# Patient Record
Sex: Male | Born: 1970 | Race: Black or African American | Hispanic: No | Marital: Single | Smoking: Never smoker
Health system: Southern US, Community
[De-identification: ages and names within clinical notes are randomized; demographics above are authoritative.]

## PROBLEM LIST (undated history)

## (undated) DIAGNOSIS — R208 Other disturbances of skin sensation: Secondary | ICD-10-CM

## (undated) DIAGNOSIS — J301 Allergic rhinitis due to pollen: Secondary | ICD-10-CM

## (undated) DIAGNOSIS — M1990 Unspecified osteoarthritis, unspecified site: Secondary | ICD-10-CM

## (undated) DIAGNOSIS — Z807 Family history of other malignant neoplasms of lymphoid, hematopoietic and related tissues: Secondary | ICD-10-CM

## (undated) HISTORY — PX: ORIF TIBIA FRACTURE: SHX5416

## (undated) HISTORY — DX: Other disturbances of skin sensation: R20.8

## (undated) HISTORY — PX: ORIF ANKLE FRACTURE: SHX5408

## (undated) HISTORY — DX: Unspecified osteoarthritis, unspecified site: M19.90

## (undated) HISTORY — DX: Allergic rhinitis due to pollen: J30.1

## (undated) HISTORY — DX: Family history of other malignant neoplasms of lymphoid, hematopoietic and related tissues: Z80.7

## (undated) HISTORY — PX: HIP SURGERY: SHX245

## (undated) HISTORY — PX: FRACTURE SURGERY: SHX138

## (undated) HISTORY — PX: KNEE ARTHROSCOPY: SUR90

---

## 2012-11-23 ENCOUNTER — Ambulatory Visit (INDEPENDENT_AMBULATORY_CARE_PROVIDER_SITE_OTHER): Payer: 59 | Admitting: Family Medicine

## 2012-11-23 ENCOUNTER — Encounter: Payer: Self-pay | Admitting: Family Medicine

## 2012-11-23 VITALS — BP 130/80 | HR 76 | Temp 98.3°F | Ht 73.0 in | Wt 243.5 lb

## 2012-11-23 DIAGNOSIS — R209 Unspecified disturbances of skin sensation: Secondary | ICD-10-CM

## 2012-11-23 DIAGNOSIS — M549 Dorsalgia, unspecified: Secondary | ICD-10-CM

## 2012-11-23 DIAGNOSIS — R5383 Other fatigue: Secondary | ICD-10-CM

## 2012-11-23 DIAGNOSIS — Z0000 Encounter for general adult medical examination without abnormal findings: Secondary | ICD-10-CM

## 2012-11-23 DIAGNOSIS — Z131 Encounter for screening for diabetes mellitus: Secondary | ICD-10-CM

## 2012-11-23 DIAGNOSIS — R208 Other disturbances of skin sensation: Secondary | ICD-10-CM

## 2012-11-23 DIAGNOSIS — Z13220 Encounter for screening for lipoid disorders: Secondary | ICD-10-CM

## 2012-11-23 DIAGNOSIS — J309 Allergic rhinitis, unspecified: Secondary | ICD-10-CM

## 2012-11-23 DIAGNOSIS — Z807 Family history of other malignant neoplasms of lymphoid, hematopoietic and related tissues: Secondary | ICD-10-CM

## 2012-11-23 HISTORY — DX: Family history of other malignant neoplasms of lymphoid, hematopoietic and related tissues: Z80.7

## 2012-11-23 NOTE — Progress Notes (Signed)
Nature conservation officer at [facility] [address] [place] [address] Phone: [phone] Fax: [phone]  Date:  11/23/2012   Name:  Derek Huffman   DOB:  01/31/1971   MRN:  [id number] Gender: male Age: 42 y.o.  Primary Physician:  Hannah Beat, MD  Evaluating MD: Hannah Beat, MD   Chief Complaint: Establish Care   History of Present Illness:  Derek Huffman is a 42 y.o. pleasant patient who presents with the following:  Crushed between metal pole and a vehicle.  Cannot bend last 3 toes  Boiled, broiledgoo and  Mostly water, juice.   Preventative Health Maintenance Visit:  Health Maintenance Summary Reviewed and updated, unless pt declines services.  Tobacco History Reviewed. Alcohol: No concerns, no excessive use Exercise Habits: less now than in past STD concerns: no risk or activity to increase risk Drug Use: None Encouraged self-testicular check   Patient Active Problem List   Diagnosis Date Noted  . Allergic rhinitis 11/24/2012  . Family history of multiple myeloma 11/23/2012    Past Medical History  Diagnosis Date  . Arthritis   . Family history of multiple myeloma 11/23/2012    Past Surgical History  Procedure Laterality Date  . Fracture surgery Right   . Orif ankle fracture Right   . Orif tibia fracture Right   . Hip surgery Right   . Knee arthroscopy Right     History   Social History  . Marital Status: Single    Spouse Name: N/A    Number of Children: N/A  . Years of Education: N/A   Occupational History  . Not on file.   Social History Main Topics  . Smoking status: Never Smoker   . Smokeless tobacco: Never Used  . Alcohol Use: No  . Drug Use: No  . Sexually Active: Yes -- Male partner(s)   Other Topics Concern  . Not on file   Social History Narrative   Formerly Army x 9 years    Family History  Problem Relation Age of Onset  . Multiple myeloma Father 51    Spinal cancer? (presume MM)    Allergies   Allergen Reactions  . Aspirin Anaphylaxis    No current outpatient prescriptions on file prior to visit.   No current facility-administered medications on file prior to visit.     Review of Systems:   Wt Readings from Last 3 Encounters:  11/23/12 243 lb 8 oz (110.451 kg)    GEN: well developed, well nourished, no acute distress Eyes: conjunctiva and lids normal, PERRLA, EOMI ENT: TM clear, nares clear, oral exam WNL Neck: supple, no lymphadenopathy, no thyromegaly, no JVD Pulm: clear to auscultation and percussion, respiratory effort normal CV: regular rate and rhythm, S1-S2, no murmur, rub or gallop, no bruits, peripheral pulses normal and symmetric, no cyanosis, clubbing, edema or varicosities Chest: no scars, masses GI: soft, non-tender; no hepatosplenomegaly, masses; active bowel sounds all quadrants GU: no hernia, testicular mass, penile discharge, or prostate enlargement Lymph: no cervical, axillary or inguinal adenopathy MSK: ONGOING PAIN AND DISCOMFORT WITH R ANKLE AND LEG SKIN: clear, good turgor, color WNL, no rashes, lesions, or ulcerations Neuro: normal mental status, normal strength, sensation, and motion Psych: alert; oriented to person, place and time, normally interactive and not anxious or depressed in appearance.   Physical Examination: BP 130/80  Pulse 76  Temp(Src) 98.3 F (36.8 C) (Oral)  Ht 6\' 1"  (1.854 m)  Wt 243 lb 8 oz (110.451 kg)  BMI  32.13 kg/m2  SpO2 97%  Ideal Body Weight: Weight in (lb) to have BMI = 25: 189.1  GEN: well developed, well nourished, no acute distress Eyes: conjunctiva and lids normal, PERRLA, EOMI ENT: TM clear, nares clear, oral exam WNL Neck: supple, no lymphadenopathy, no thyromegaly, no JVD Pulm: clear to auscultation and percussion, respiratory effort normal CV: regular rate and rhythm, S1-S2, no murmur, rub or gallop, no bruits, peripheral pulses normal and symmetric, no cyanosis, clubbing, edema or  varicosities GI: soft, non-tender; no hepatosplenomegaly, masses; active bowel sounds all quadrants GU: no hernia, testicular mass, penile discharge Lymph: no cervical, axillary or inguinal adenopathy MSK: gait normal, muscle tone and strength WNL SKIN: clear, good turgor, color WNL, no rashes, lesions, or ulcerations Neuro: normal mental status, normal strength, sensation, and motion Psych: alert; oriented to person, place and time, normally interactive and not anxious or depressed in appearance.  Assessment and Plan:  Routine general medical examination at a health care facility - Plan: Basic metabolic panel, CBC With differential/Platelet, Hepatic function panel, Lipid panel, Protein electrophoresis, serum, IGE  Family history of multiple myeloma - Plan: Basic metabolic panel, CBC With differential/Platelet, Hepatic function panel, Lipid panel, Protein electrophoresis, serum, IGE, CANCELED: Protein electrophoresis, serum  Back pain - Plan: Basic metabolic panel, CBC With differential/Platelet, Hepatic function panel, Lipid panel, Protein electrophoresis, serum, IGE, CANCELED: Protein electrophoresis, serum  Screening for diabetes mellitus - Plan: Basic metabolic panel, CBC With differential/Platelet, Hepatic function panel, Lipid panel, Protein electrophoresis, serum, IGE, CANCELED: Basic metabolic panel  Other malaise and fatigue - Plan: Basic metabolic panel, CBC With differential/Platelet, Hepatic function panel, Lipid panel, Protein electrophoresis, serum, IGE, CANCELED: CBC with Differential, CANCELED: Hepatic function panel  Screening for lipoid disorders - Plan: Basic metabolic panel, CBC With differential/Platelet, Hepatic function panel, Lipid panel, Protein electrophoresis, serum, IGE, CANCELED: LDL cholesterol, direct, CANCELED: Lipid panel  Allergic rhinitis - Plan: Basic metabolic panel, CBC With differential/Platelet, Hepatic function panel, Lipid panel, Protein  electrophoresis, serum, IGE, CANCELED: IGE  Decreased sensation of foot  No labs in some time. Check all these Check allergy - IgE High level of concern or potential spine CA - reasonable to check SPEP  Orders Today:  Orders Placed This Encounter  Procedures  . Basic metabolic panel  . CBC With differential/Platelet  . Hepatic function panel  . Lipid panel  . Protein electrophoresis, serum  . IGE    Updated Medication List: (Includes new medications, updates to list, dose adjustments) Meds ordered this encounter  Medications  . Multiple Vitamin (MULTIVITAMIN WITH MINERALS) TABS    Sig: Take 1 tablet by mouth daily.    Medications Discontinued: There are no discontinued medications.    Signed, Elpidio Galea. Gerald Kuehl, MD 11/23/2012 2:16 PM

## 2012-11-24 ENCOUNTER — Encounter: Payer: Self-pay | Admitting: Family Medicine

## 2012-11-24 DIAGNOSIS — R208 Other disturbances of skin sensation: Secondary | ICD-10-CM | POA: Insufficient documentation

## 2012-11-24 DIAGNOSIS — J309 Allergic rhinitis, unspecified: Secondary | ICD-10-CM | POA: Insufficient documentation

## 2012-11-25 LAB — HEPATIC FUNCTION PANEL
AST: 22 IU/L (ref 0–40)
Albumin: 4.1 g/dL (ref 3.5–5.5)
Alkaline Phosphatase: 107 IU/L (ref 39–117)
Bilirubin, Direct: 0.17 mg/dL (ref 0.00–0.40)

## 2012-11-25 LAB — PROTEIN ELECTROPHORESIS, SERUM
Alpha 2: 0.5 g/dL (ref 0.4–1.2)
Beta: 1.1 g/dL (ref 0.6–1.3)
Gamma Globulin: 1.1 g/dL (ref 0.5–1.6)
Globulin, Total: 3 g/dL (ref 2.0–4.5)

## 2012-11-25 LAB — BASIC METABOLIC PANEL
BUN/Creatinine Ratio: 10 (ref 8–19)
BUN: 11 mg/dL (ref 6–20)
Calcium: 9.2 mg/dL (ref 8.7–10.2)
Creatinine, Ser: 1.14 mg/dL (ref 0.76–1.27)
GFR calc non Af Amer: 81 mL/min/{1.73_m2} (ref >59)
Sodium: 142 mmol/L (ref 134–144)

## 2012-11-25 LAB — CBC WITH DIFFERENTIAL
Basos: 0 % (ref 0–3)
Eos: 3 % (ref 0–5)
Eosinophils Absolute: 0.1 10*3/uL (ref 0.0–0.4)
HCT: 44.7 % (ref 37.5–51.0)
Hemoglobin: 15.2 g/dL (ref 12.6–17.7)
Immature Grans (Abs): 0 10*3/uL (ref 0.0–0.1)
Lymphs: 29 % (ref 14–46)
MCH: 28.4 pg (ref 26.6–33.0)
MCHC: 34 g/dL (ref 31.5–35.7)
Neutrophils Absolute: 2.5 10*3/uL (ref 1.4–7.0)
RBC: 5.35 x10E6/uL (ref 4.14–5.80)

## 2012-11-25 LAB — LIPID PANEL
HDL: 41 mg/dL (ref >39)
Triglycerides: 99 mg/dL (ref 0–149)

## 2012-11-26 ENCOUNTER — Encounter: Payer: Self-pay | Admitting: *Deleted

## 2012-12-15 ENCOUNTER — Encounter: Payer: Self-pay | Admitting: Family Medicine

## 2013-04-28 ENCOUNTER — Encounter: Payer: Self-pay | Admitting: Family Medicine

## 2013-04-28 ENCOUNTER — Ambulatory Visit (INDEPENDENT_AMBULATORY_CARE_PROVIDER_SITE_OTHER): Payer: 59 | Admitting: Family Medicine

## 2013-04-28 VITALS — BP 108/82 | HR 79 | Temp 98.1°F | Ht 73.0 in | Wt 245.5 lb

## 2013-04-28 DIAGNOSIS — H938X9 Other specified disorders of ear, unspecified ear: Secondary | ICD-10-CM

## 2013-04-28 DIAGNOSIS — K625 Hemorrhage of anus and rectum: Secondary | ICD-10-CM

## 2013-04-28 DIAGNOSIS — H938X3 Other specified disorders of ear, bilateral: Secondary | ICD-10-CM

## 2013-04-28 NOTE — Progress Notes (Signed)
Pre-visit discussion using our clinic review tool. No additional management support is needed unless otherwise documented below in the visit note.  

## 2013-04-28 NOTE — Progress Notes (Signed)
Date:  04/28/2013   Name:  Derek Huffman   DOB:  08-Jul-1970   MRN:  [id number] Gender: male Age: 42 y.o.  Primary Physician:  Hannah Beat, MD   Chief Complaint: Rectal Bleeding and Check Ears For wax   History of Present Illness:  Derek Huffman is a 42 y.o. pleasant patient who presents with the following:  Wonders if ears near to be cleaned. Had some blood in his stool. A few times this week with bright red blood, then some dark stools which promptly resolved. No problems the last couple of days.  Wt Readings from Last 3 Encounters:  04/28/13 245 lb 8 oz (111.358 kg)  11/23/12 243 lb 8 oz (110.451 kg)     Patient Active Problem List   Diagnosis Date Noted  . Allergic rhinitis 11/24/2012  . Decreased sensation of foot   . Family history of multiple myeloma 11/23/2012    Past Medical History  Diagnosis Date  . Arthritis   . Family history of multiple myeloma 11/23/2012  . Allergic rhinitis due to pollen   . Decreased sensation of foot     Outer 3 toes    Past Surgical History  Procedure Laterality Date  . Fracture surgery Right   . Orif ankle fracture Right   . Orif tibia fracture Right   . Hip surgery Right   . Knee arthroscopy Right     History   Social History  . Marital Status: Single    Spouse Name: N/A    Number of Children: N/A  . Years of Education: N/A   Occupational History  . Not on file.   Social History Main Topics  . Smoking status: Never Smoker   . Smokeless tobacco: Never Used  . Alcohol Use: No  . Drug Use: No  . Sexual Activity: Yes    Partners: Female   Other Topics Concern  . Not on file   Social History Narrative   Formerly Army x 9 years    Family History  Problem Relation Age of Onset  . Multiple myeloma Father 22    Spinal cancer? (presume MM)    Allergies  Allergen Reactions  . Aspirin Anaphylaxis    Medication list has been reviewed and updated.  Review of Systems:   GEN: No acute illnesses, no  fevers, chills. GI: No n/v/d, eating normally Pulm: No SOB Interactive and getting along well at home.  Otherwise, ROS is as per the HPI.   Physical Examination: BP 108/82  Pulse 79  Temp(Src) 98.1 F (36.7 C) (Oral)  Ht 6\' 1"  (1.854 m)  Wt 245 lb 8 oz (111.358 kg)  BMI 32.40 kg/m2  Ideal Body Weight: Weight in (lb) to have BMI = 25: 189.1  GEN: WDWN, NAD, Non-toxic, A & O x 3 HEENT: Atraumatic, Normocephalic. Neck supple. No masses, No LAD. Ears and Nose: No external deformity. CV: RRR, No M/G/R. No JVD. No thrill. No extra heart sounds. PULM: CTA B, no wheezes, crackles, rhonchi. No retractions. No resp. distress. No accessory muscle use. ABD: S, NT, ND, +BS. No rebound. No HSM. Rectal: good tone. No fissure seen. Small internal hemorrhoid? But definitely none large EXTR: No c/c/e NEURO Normal gait.  PSYCH: Normally interactive. Conversant. Not depressed or anxious appearing.  Calm demeanor.    Assessment and Plan:  Rectal bleeding  Ear fullness, bilateral  Resolved, i think ok to watch, but if he has return of blood again, then i think we need to consult  gi.   Ears are clean.  Signed,  Elpidio Galea. Elda Dunkerson, MD, CAQ Sports Medicine  [facility] [address] [place] [address] Phone: [phone] Fax: [phone]  Updated Complete Medication List:   Medication List       This list is accurate as of: 04/28/13 11:59 PM.  Always use your most recent med list.               multivitamin with minerals Tabs tablet  Take 1 tablet by mouth daily.

## 2014-05-13 ENCOUNTER — Ambulatory Visit (INDEPENDENT_AMBULATORY_CARE_PROVIDER_SITE_OTHER): Payer: 59 | Admitting: Family Medicine

## 2014-05-13 ENCOUNTER — Encounter: Payer: Self-pay | Admitting: Family Medicine

## 2014-05-13 ENCOUNTER — Encounter (INDEPENDENT_AMBULATORY_CARE_PROVIDER_SITE_OTHER): Payer: Self-pay

## 2014-05-13 VITALS — BP 110/76 | HR 56 | Temp 97.9°F | Ht 73.0 in | Wt 252.0 lb

## 2014-05-13 DIAGNOSIS — R202 Paresthesia of skin: Secondary | ICD-10-CM

## 2014-05-13 DIAGNOSIS — R200 Anesthesia of skin: Secondary | ICD-10-CM | POA: Insufficient documentation

## 2014-05-13 DIAGNOSIS — M79602 Pain in left arm: Secondary | ICD-10-CM | POA: Insufficient documentation

## 2014-05-13 NOTE — Patient Instructions (Signed)
Ibuprofen 800 mg every 8 hours for pain. Stop at front desk to set up nerve conduction.  Limit pressure on elbow in case this is ulnar neuropathy.

## 2014-05-13 NOTE — Progress Notes (Signed)
Subjective:    Patient ID: Derek Huffman, male    DOB: 1970-07-22, 43 y.o.   MRN: [id number]  HPI    43 year old male pt of Dr. Cyndie Chimeopland's presents with new onset left hand numbness  X 3-4 months. Started in pinky tingling and extends to rest of hand. Intermittently numb and tingling. Occuring 4-5 times a day. Gradually worsening. Arm soreness in upper arm, no in wrist or hand started in last few months. Notes after carrying laundry basket but also at rest. No weakness. No neck pain, no shooting pain.  In last 3 days he has noted numbness in right pinky. No pain in right arm.  He does put pressure on left elbow as a habit.  Tylenol helps with arm pain.   Had similar issue 3.5 years ago. Went away on its own.  Had neg EKG. Had MRI brain: no MS, no CVA.  Hx of broken wrist as teenage. Unemployed, doing some yard or house work.     Review of Systems  Constitutional: Negative for fever and fatigue.  HENT: Negative for ear pain.   Eyes: Negative for pain.  Respiratory: Negative for shortness of breath.   Cardiovascular: Negative for chest pain.  Gastrointestinal: Negative for abdominal pain.       Objective:   Physical Exam  Constitutional: He is oriented to person, place, and time. Vital signs are normal. He appears well-developed and well-nourished.  HENT:  Head: Normocephalic.  Right Ear: Hearing normal.  Left Ear: Hearing normal.  Nose: Nose normal.  Mouth/Throat: Oropharynx is clear and moist and mucous membranes are normal.  Neck: Trachea normal. Carotid bruit is not present. No thyroid mass and no thyromegaly present.  Cardiovascular: Normal rate, regular rhythm and normal pulses.  Exam reveals no gallop, no distant heart sounds and no friction rub.   No murmur heard. No peripheral edema  Pulmonary/Chest: Effort normal and breath sounds normal. No respiratory distress.  Musculoskeletal:       Left elbow: Normal. He exhibits normal range of motion, no swelling  and no effusion. No tenderness found. No radial head, no medial epicondyle, no lateral epicondyle and no olecranon process tenderness noted.       Left wrist: He exhibits decreased range of motion.       Cervical back: Normal. He exhibits normal range of motion, no tenderness and no bony tenderness.       Right upper arm: Normal. He exhibits no tenderness and no bony tenderness.       Left upper arm: Normal. He exhibits no tenderness, no bony tenderness and no swelling.       Right forearm: Normal. He exhibits no tenderness, no bony tenderness, no swelling, no edema and no deformity.       Left forearm: Normal. He exhibits no tenderness, no bony tenderness and no swelling.  Cannot bend left wrist  Due to past fracture  Neurological: He is alert and oriented to person, place, and time. He has normal strength. He displays no atrophy. No cranial nerve deficit or sensory deficit. He exhibits normal muscle tone. He displays a negative Romberg sign. Gait normal.  Neg tinel , neg phalen, neg spurling, neg ulnar compression  Skin: Skin is warm, dry and intact. No rash noted.  Psychiatric: He has a normal mood and affect. His speech is normal and behavior is normal. Thought content normal.          Assessment & Plan:  Most likely ulnar compression causing  numbness but is atypical. He does lean on left elbow frequently as a habit.  No clear sign of cervical radiculopathy. He has limited motion at left wrist given past injury. Will send for nerve conduction to determine location of compression causing neuropathy.  Start NSAIDs for pain.

## 2014-05-13 NOTE — Progress Notes (Signed)
Pre visit review using our clinic review tool, if applicable. No additional management support is needed unless otherwise documented below in the visit note. 

## 2014-05-16 ENCOUNTER — Telehealth: Payer: Self-pay | Admitting: Family Medicine

## 2014-05-16 NOTE — Telephone Encounter (Signed)
-----   Message from Carlton AdamMarion Kolovrat sent at 05/16/2014  3:14 PM EST ----- Regarding: Neurology Referral  Dr B, Patient wants to go to [facility] for Nerve Conduction study. They need you to please put a Neurology Referral in and put in referral this is for upper or lower extremity nerve conduction. They wont schedule off an order it needs to be a Referral. Shirlee LimerickMarion

## 2014-05-16 NOTE — Telephone Encounter (Signed)
Put in referral as requested

## 2014-05-16 NOTE — Addendum Note (Signed)
Addended byKerby Nora: Derek Huffman on: 05/16/2014 05:30 PM   Modules accepted: Orders

## 2014-05-16 NOTE — Addendum Note (Signed)
Addended by: Kerby NoraBEDSOLE, Seylah Wernert E on: 05/16/2014 05:32 PM   Modules accepted: Orders

## 2014-05-18 ENCOUNTER — Other Ambulatory Visit: Payer: Self-pay | Admitting: *Deleted

## 2014-05-18 DIAGNOSIS — R200 Anesthesia of skin: Secondary | ICD-10-CM

## 2014-05-18 DIAGNOSIS — R202 Paresthesia of skin: Principal | ICD-10-CM

## 2014-07-06 ENCOUNTER — Ambulatory Visit (INDEPENDENT_AMBULATORY_CARE_PROVIDER_SITE_OTHER): Payer: 59 | Admitting: Neurology

## 2014-07-06 DIAGNOSIS — R200 Anesthesia of skin: Secondary | ICD-10-CM

## 2014-07-06 DIAGNOSIS — G5622 Lesion of ulnar nerve, left upper limb: Secondary | ICD-10-CM

## 2014-07-06 DIAGNOSIS — G5602 Carpal tunnel syndrome, left upper limb: Secondary | ICD-10-CM

## 2014-07-06 DIAGNOSIS — R202 Paresthesia of skin: Secondary | ICD-10-CM

## 2014-07-06 NOTE — Procedures (Addendum)
[facility]  [address], [address]  [place], [place] [address] Tel: [phone] Fax:  [phone] Test Date:  07/06/2014  Patient: Derek Huffman DOB: 09/07/1970 Physician: Nita Sickleonika Laurence Crofford, DO  Sex: Male Height: 6\' 1"  Ref Phys: Kerby NoraBedsole, Amy  ID#: [id number] Temp: 33.5C Technician:    Patient Complaints: This is a 44 year-old gentleman presenting for evaluation of left hand paresthesias, worse in the hands,  NCV & EMG Findings: Extensive electrodiagnostic testing of the left upper extremity shows:  1. Median sensory response is mildly prolonged. Ulnar and radial sensory responses are within normal limits.  2. Median motor responses within normal limits. Ulnar motor response shows decreased conduction velocity (A Elbow-B Elbow, 48 m/s).   3. Sparse chronic motor axon loss changes are seen affecting the deltoid and infraspinatus muscles, without accompanied active denervation.   Impression: 1. Left ulnar neuropathy at the elbow, purely demyelinating in type. 2. Left median neuropathy at or distal to the wrist, consistent with clinical diagnosis of carpal tunnel syndrome. Overall, these findings are very mild in degree electrically. 3. Chronic C5 radiculopathy affecting the left upper extremity, mild in degree electrically.   ___________________________ Nita Sickleonika Javen Hinderliter, DO    Nerve Conduction Studies Anti Sensory Summary Table   Site NR Peak (ms) Norm Peak (ms) P-T Amp (V) Norm P-T Amp  Left Median Anti Sensory (2nd Digit)  Wrist    3.5 <3.4 28.2 >20  Left Radial Anti Sensory (Base 1st Digit)  Wrist    2.0 <2.7 29.1 >18  Left Ulnar Anti Sensory (5th Digit)  Wrist    2.8 <3.1 30.9 >12   Motor Summary Table   Site NR Onset (ms) Norm Onset (ms) O-P Amp (mV) Norm O-P Amp Site1 Site2 Delta-0 (ms) Dist (cm) Vel (m/s) Norm Vel (m/s)  Left Median Motor (Abd Poll Brev)  Wrist    3.8 <3.9 11.5 >6 Elbow Wrist 5.2 32.0 62 >50  Elbow    9.0  10.8         Left Ulnar Motor  (Abd Dig Minimi)  Wrist    2.6 <3.1 12.4 >7 B Elbow Wrist 4.7 29.0 62 >50  B Elbow    7.3  11.9  A Elbow B Elbow 2.1 10.0 48 >50  A Elbow    9.4  11.6          EMG   Side Muscle Ins Act Fibs Psw Fasc Number Recrt Dur Dur. Amp Amp. Poly Poly. Comment  Left 1stDorInt Nml Nml Nml Nml Nml Nml Nml Nml Nml Nml Nml Nml N/A  Left Abd Poll Brev Nml Nml Nml Nml Nml Nml Nml Nml Nml Nml Nml Nml N/A  Left Ext Indicis Nml Nml Nml Nml Nml Nml Nml Nml Nml Nml Nml Nml N/A  Left PronatorTeres Nml Nml Nml Nml Nml Nml Nml Nml Nml Nml Nml Nml N/A  Left Biceps Nml Nml Nml Nml Nml Nml Nml Nml Nml Nml Nml Nml N/A  Left Triceps Nml Nml Nml Nml Nml Nml Nml Nml Nml Nml Nml Nml N/A  Left Deltoid Nml Nml Nml Nml 1- Mod-R Some 1+ Some 1+ Nml Nml N/A  Left Infraspinatus Nml Nml Nml Nml 1- Mod-R Some 1+ Few 1+ Nml Nml N/A      Waveforms:

## 2014-07-07 ENCOUNTER — Telehealth: Payer: Self-pay | Admitting: Family Medicine

## 2014-07-07 NOTE — Telephone Encounter (Signed)
Damein notified as instructed by telephone.

## 2014-07-07 NOTE — Telephone Encounter (Signed)
Notify pt that he has evidence of ulnar neuropathy  As well as carpal tunnel AND some change from cervical irritation. All of these are mild but combined likely causing his symptoms. If he is not improving schedule follow up with his PCP.

## 2014-08-14 ENCOUNTER — Emergency Department: Payer: Self-pay | Admitting: Emergency Medicine

## 2014-10-03 ENCOUNTER — Ambulatory Visit: Payer: 59 | Admitting: Family Medicine

## 2014-10-03 DIAGNOSIS — Z0289 Encounter for other administrative examinations: Secondary | ICD-10-CM

## 2014-10-10 ENCOUNTER — Ambulatory Visit (INDEPENDENT_AMBULATORY_CARE_PROVIDER_SITE_OTHER): Payer: 59 | Admitting: Family Medicine

## 2014-10-10 ENCOUNTER — Encounter: Payer: Self-pay | Admitting: *Deleted

## 2014-10-10 ENCOUNTER — Encounter: Payer: Self-pay | Admitting: Family Medicine

## 2014-10-10 VITALS — BP 110/76 | HR 67 | Temp 97.8°F | Ht 73.0 in | Wt 254.5 lb

## 2014-10-10 DIAGNOSIS — M5412 Radiculopathy, cervical region: Secondary | ICD-10-CM | POA: Diagnosis not present

## 2014-10-10 DIAGNOSIS — G5622 Lesion of ulnar nerve, left upper limb: Secondary | ICD-10-CM

## 2014-10-10 DIAGNOSIS — G5602 Carpal tunnel syndrome, left upper limb: Secondary | ICD-10-CM

## 2014-10-10 MED ORDER — METHYLPREDNISOLONE ACETATE 40 MG/ML IJ SUSP
20.0000 mg | Freq: Once | INTRAMUSCULAR | Status: AC
  Administered 2014-10-10: 20 mg via INTRA_ARTICULAR

## 2014-10-10 NOTE — Patient Instructions (Signed)
Cubital tunnel splint - wear at night

## 2014-10-10 NOTE — Progress Notes (Signed)
Pre visit review using our clinic review tool, if applicable. No additional management support is needed unless otherwise documented below in the visit note. 

## 2014-10-10 NOTE — Progress Notes (Signed)
Dr. Karleen Hampshire T. Eldean Klatt, MD, [facility] [address] [place], [address] Phone: [phone] Fax: [phone]  10/10/2014  Patient: Derek Huffman, MRN: [id number], DOB: 1970-09-06, 44 y.o.  Primary Physician:  Hannah Beat, MD  Chief Complaint: Follow-up  Subjective:   Derek Huffman is a 44 y.o. very pleasant male patient who presents with the following:  Patient is here in follow-up.  I actually have not seen the patient for quite some time.  He saw my partner a number of months ago, and she referred him for nerve conduction studies and EMG on the left side.  He seems confused or 2 not recall the conversation he had when our staff called him to give him these results.  The note seem to indicate that he was asked to follow up if not better, and now he is here several months later.  Briefly, the patient has neuropathic sensations in decreased sensation on his left hand throughout as well as entirety of the arm and hand.  EMG and nerve conduction studies are below.  The patient has never had an never worn cockup wrist splints at night.  He has never worn a cubital tunnel splint while he is sleeping.  He has never had an injection of his carpal tunnel.  He is never been on neuropathic pain medications.  He also has a significant history for a trauma with a closed but comminuted fracture on the left wrist per his report.  This was treated conservatively.  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  CTS: broke wrist as teenager, never used splints.   NCV & EMG Findings: Extensive electrodiagnostic testing of the left upper extremity  shows:  1. Median sensory response is mildly prolonged. Ulnar and radial  sensory responses are within normal limits.  2. Median motor responses within normal limits. Ulnar motor  response shows decreased conduction velocity (A Elbow-B Elbow, 48  m/s).  3.  Sparse chronic motor axon loss changes are seen affecting the  deltoid and infraspinatus muscles, without accompanied active  denervation.   Impression: 1. Left ulnar neuropathy at the elbow, purely demyelinating in  type. 2. Left median neuropathy at or distal to the wrist, consistent  with clinical diagnosis of carpal tunnel syndrome. Overall, these  findings are very mild in degree electrically. 3. Chronic C5 radiculopathy affecting the left upper extremity,  mild in degree electrically.   ___________________________ Nita Sickle, DO   Past Medical History, Surgical History, Social History, Family History, Problem List, Medications, and Allergies have been reviewed and updated if relevant.  GEN: no acute illness or fever CV: No chest pain or shortness of breath MSK: detailed above Neuro: neurological signs are described above ROS O/w per HPI  Objective:   BP 110/76 mmHg  Pulse 67  Temp(Src) 97.8 F (36.6 C) (Oral)  Ht  (1.854 m)  Wt 254 lb 8 oz (115.44 kg)  BMI 33.58 kg/m2   GEN: WDWN, NAD, Non-toxic, Alert & Oriented x 3 HEENT: Atraumatic, Normocephalic.  Ears and Nose: No external deformity. EXTR: No clubbing/cyanosis/edema NEURO: Normal gait.  PSYCH: Normally interactive. Conversant. Not depressed or anxious appearing.  Calm demeanor.   CERVICAL SPINE EXAM Range of motion: Flexion, extension, lateral bending, and rotation: preserved Pain with terminal motion: mild Spinous Processes: NT SCM: NT Upper paracervical muscles: mild ttp Upper traps: NT  L elbow Ecchymosis or edema: neg ROM: full flexion, extension,  pronation, supination Shoulder ROM: Full Flexion: 5/5 Extension: 5/5 Supination: 5/5  Pronation: 5/5 Wrist ext: 5/5 Wrist flexion: 5/5 No gross bony abnormality Varus and Valgus stress: stable ECRB tenderness: neg Medial epicondyle: NT Lateral epicondyle, resisted wrist extension from wrist full pronation and flexion: NT grip:  5/5 Tinel's, Elbow: POS  Hand: L Ecchymosis or edema: neg ROM wrist/hand/digits/elbow: full  Carpals, MCP's, digits: NT Distal Ulna and Radius: NT Supination lift test: neg Ecchymosis or edema: neg Cysts/nodules: neg Finkelstein's test: neg Snuffbox tenderness: neg Scaphoid tubercle: NT Hook of Hamate: NT Resisted supination: NT Full composite fist Grip, all digits: 5/5 str No tenosynovitis Axial load test: neg Phalen's: pos Tinel's: pos Atrophy: neg  Hand sensation: decreased whole hand  Radiology:  Assessment and Plan:   Carpal tunnel syndrome, left - Plan: methylPREDNISolone acetate (DEPO-MEDROL) injection 20 mg  Cubital tunnel syndrome, left  Cervical radiculopathy at C5  Carpal Tunnel Syndrome: We discussed the anatomy involved, and that carpal tunnel syndrome primarily involves the median nerve, and this typically affects digits one through 3. We also discussed that mild cases of carpal tunnel syndrome are often improved with night splints, and it is very reasonable to consider a carpal tunnel injection. If the patient does have moderate to severe carpal tunnel syndrome based on NCV, then it is certainly reasonable to consider carpal tunnel release, which was discussed with the patient. We also discussed his severe carpal tunnel syndrome can lead to permanent nerve impairment even if released. At this point, the patient like to proceed conservatively.   CTS Injection, LEFT Verbal consent was obtained. Risks (including rare risk of infection), benefits, and alternatives were discussed. Prepped with Chloraprep and Ethyl Chloride used for anesthesia. Under sterile conditions,  the patient was injected just ulnar to the palmaris longus tendon at the wrist flexion crease.  The needle was inserted at 45 degree angle aiming distally. Aspiration showed no blood. Medication flowed freely without resistance.  Needle size: 22 gauge 1 1/2 inch Injection: 1/2 cc of Lidocaine 1% and  Depo-Medrol 20 mg   Cubital tunnel syndrome or ulnar neuropathy is an entrapment neuropathy of the ulnar nerve at the elbow. This often occurs from repetitive trauma to this area from resting the elbow during occupational situations. Sometimes sleeping in flexion at the elbow can induce this as well. I reviewed the relevant anatomy and gave the patient a handout on this condition, which reviews recommendations, basic nerve gliding and conservative care.   I also recommended either getting a ulnar neuropathy brace for nighttime or to wrap the elbow with towels and then to use tape to keep it in relative extension.  Recommended for daytime use to use a pad or an elbow pad, such as those used by collegiate wrestlers.    C5 radiculopathy that is also ongoing.  He has multiple nerves involved, which complicates the picture somewhat.  We are going to primarily treat his ulnar nerve and his median nerve at the carpal tunnel to see if he can get some relief of symptoms.  Recheck in 2 months.  Follow-up: Return in about 2 months (around 12/10/2014).  Signed,  Elpidio GaleaSpencer T. Reene Harlacher, MD   Patient's Medications  New Prescriptions   No medications on file  Previous Medications   MULTIPLE VITAMIN (MULTIVITAMIN WITH MINERALS) TABS    Take 1 tablet by mouth daily.  Modified Medications   No medications on file  Discontinued Medications   No medications on file

## 2014-12-12 ENCOUNTER — Ambulatory Visit (INDEPENDENT_AMBULATORY_CARE_PROVIDER_SITE_OTHER): Payer: 59 | Admitting: Family Medicine

## 2014-12-12 ENCOUNTER — Encounter: Payer: Self-pay | Admitting: Family Medicine

## 2014-12-12 VITALS — BP 124/80 | HR 84 | Temp 98.0°F | Ht 72.0 in | Wt 253.5 lb

## 2014-12-12 DIAGNOSIS — Z131 Encounter for screening for diabetes mellitus: Secondary | ICD-10-CM | POA: Diagnosis not present

## 2014-12-12 DIAGNOSIS — Z9889 Other specified postprocedural states: Secondary | ICD-10-CM

## 2014-12-12 DIAGNOSIS — Z807 Family history of other malignant neoplasms of lymphoid, hematopoietic and related tissues: Secondary | ICD-10-CM | POA: Diagnosis not present

## 2014-12-12 DIAGNOSIS — Z0000 Encounter for general adult medical examination without abnormal findings: Secondary | ICD-10-CM | POA: Diagnosis not present

## 2014-12-12 DIAGNOSIS — M25571 Pain in right ankle and joints of right foot: Secondary | ICD-10-CM

## 2014-12-12 DIAGNOSIS — R5383 Other fatigue: Secondary | ICD-10-CM | POA: Diagnosis not present

## 2014-12-12 DIAGNOSIS — Z13220 Encounter for screening for lipoid disorders: Secondary | ICD-10-CM | POA: Diagnosis not present

## 2014-12-12 DIAGNOSIS — Z967 Presence of other bone and tendon implants: Secondary | ICD-10-CM

## 2014-12-12 DIAGNOSIS — Z8781 Personal history of (healed) traumatic fracture: Secondary | ICD-10-CM

## 2014-12-12 NOTE — Patient Instructions (Signed)

## 2014-12-12 NOTE — Progress Notes (Signed)
Pre visit review using our clinic review tool, if applicable. No additional management support is needed unless otherwise documented below in the visit note. 

## 2014-12-12 NOTE — Progress Notes (Signed)
Dr. Frederico Hamman T. Deitra Craine, MD, [facility] [place], [address] Phone: [phone] Fax: [phone]  12/12/2014  Patient: Derek Huffman, MRN: [id number], DOB: December 13, 1970, 44 y.o.  Primary Physician:  Owens Loffler, MD  Chief Complaint: Annual Exam  Subjective:   Derek Huffman is a 44 y.o. pleasant patient who presents with the following:  Preventative Health Maintenance Visit and the patient has a number of other ongoing and acute issues that he would like to discuss:  Health Maintenance Summary Reviewed and updated, unless pt declines services.  Tobacco History Reviewed. Alcohol: No concerns, no excessive use Exercise Habits: Limited activity, rec at least 30 mins 5 times a week STD concerns: no risk or activity to increase risk Drug Use: None Encouraged self-testicular check  Doing a lot better from CTS and brace and post-injection.  Will go away.   Ankle pain and foot pain on the RIGHT: This is the ankle that was injured in active duty in the [employer], where he had to have multiple operations and rehabilitate his leg for approximately one year.  He has been having problems with this off and on ever since, but has worsened.  Now he is working on his feet all day long on the hot pavement, and his foot and ankle and leg are hurting a lot more compared to previously.  When he rests it, and does seem to help somewhat.  Staying up most of the days. Bad ankle and foot are more like lead lately Sleeping and when getting up to go to the bathroom.  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   If off a few days, will feel ok.   Multiple family members are reported to have died from "spinal cancer "at an early age.  He is very concerned about this.  In would like to be very proactive.  Health Maintenance  Topic Date Due  . HIV Screening  05/03/1988  . TETANUS/TDAP   05/03/1992  . INFLUENZA VACCINE  01/23/2015    There is no immunization history on file for this patient. Patient Active Problem List   Diagnosis Date Noted  . Carpal tunnel syndrome, left 10/10/2014  . Numbness and tingling in left hand 05/13/2014  . Left arm pain 05/13/2014  . Allergic rhinitis 11/24/2012  . Decreased sensation of foot   . Family history of multiple myeloma 11/23/2012   Past Medical History  Diagnosis Date  . Arthritis   . Family history of multiple myeloma 11/23/2012  . Allergic rhinitis due to pollen   . Decreased sensation of foot     Outer 3 toes   Past Surgical History  Procedure Laterality Date  . Fracture surgery Right   . Orif ankle fracture Right 1995-1997  . Orif tibia fracture Right 1995-1997  . Hip surgery Right   . Knee arthroscopy Right    History   Social History  . Marital Status: Single    Spouse Name: N/A  . Number of Children: N/A  . Years of Education: N/A   Occupational History  . Not on file.   Social History Main Topics  . Smoking status: Never Smoker   . Smokeless tobacco: Never Used  . Alcohol Use: No  . Drug Use: No  . Sexual Activity:    Partners: Female   Other Topics Concern  . Not on file   Social History  Narrative   Formerly Corporate treasurer x 9 years   Family History  Problem Relation Age of Onset  . Multiple myeloma Father 27    Spinal cancer? (presume MM)   Allergies  Allergen Reactions  . Aspirin Anaphylaxis    Medication list has been reviewed and updated.   General: Denies fever, chills, sweats. No significant weight loss. Eyes: Denies blurring,significant itching ENT: Denies earache, sore throat, and hoarseness. Cardiovascular: Denies chest pains, palpitations, dyspnea on exertion Respiratory: Denies cough, dyspnea at rest,wheeezing Breast: no concerns about lumps GI: Denies nausea, vomiting, diarrhea, constipation, change in bowel habits, abdominal pain, melena, hematochezia GU: Denies penile  discharge, ED, urinary flow / outflow problems. No STD concerns. Musculoskeletal: AS ABOVE Derm: Denies rash, itching Neuro: THE NEUROPATHIC SENSATIONS THAT HE HAS BEEN HAVING HAVE IMPROVED WITH HER CARPAL TUNNEL INJECTION AND HIS ULNAR NEUROPATHY BRACE. Psych: Denies depression, anxiety Endocrine: Denies cold intolerance, heat intolerance, polydipsia Heme: Denies enlarged lymph nodes Allergy: No hayfever  Objective:   BP 124/80 mmHg  Pulse 84  Temp(Src) 98 F (36.7 C) (Oral)  Ht 6' (1.829 m)  Wt 253 lb 8 oz (114.987 kg)  BMI 34.37 kg/m2 Ideal Body Weight: Weight in (lb) to have BMI = 25: 183.9  No exam data present  GEN: well developed, well nourished, no acute distress Eyes: conjunctiva and lids normal, PERRLA, EOMI ENT: TM clear, nares clear, oral exam WNL Neck: supple, no lymphadenopathy, no thyromegaly, no JVD Pulm: clear to auscultation and percussion, respiratory effort normal CV: regular rate and rhythm, S1-S2, no murmur, rub or gallop, no bruits, peripheral pulses normal and symmetric, no cyanosis, clubbing, edema or varicosities GI: soft, non-tender; no hepatosplenomegaly, masses; active bowel sounds all quadrants GU: no hernia, testicular mass, penile discharge Lymph: no cervical, axillary or inguinal adenopathy MSK: RIGHT ANKLE HAS DRAMATICALLY DECREASED RANGE OF MOTION COMPARED TO THE LEFT.  tHERE IS APPROXIMATELY ONLY ABOUT 20 OF MOTION IN PLANTAR AND DORSIFLEXION. SKIN: clear, good turgor, color WNL, no rashes, lesions, or ulcerations, RIGHT LEG POSTOPERATIVE CHANGES Neuro: normal mental status, normal strength, sensation, and motion Psych: alert; oriented to person, place and time, normally interactive and not anxious or depressed in appearance.  All labs reviewed with patient.  Lipids:    Component Value Date/Time   CHOL 177 12/12/2014 1613   TRIG 184* 12/12/2014 1613   HDL 35* 12/12/2014 1613   CHOLHDL 5.1* 12/12/2014 1613   CBC: CBC Latest Ref Rng  12/12/2014 11/23/2012  WBC 3.4 - 10.8 x10E3/uL 4.8 4.2  Hemoglobin 12.6 - 17.7 g/dL - 15.2  Hematocrit 37.5 - 51.0 % 45.5 44.7  Platelets 155 - 379 x10E3/uL - 876    Basic Metabolic Panel:    Component Value Date/Time   NA 143 12/12/2014 1613   K 4.1 12/12/2014 1613   CL 102 12/12/2014 1613   CO2 24 12/12/2014 1613   BUN 9 12/12/2014 1613   CREATININE 1.08 12/12/2014 1613   GLUCOSE 77 12/12/2014 1613   CALCIUM 9.1 12/12/2014 1613   Hepatic Function Latest Ref Rng 12/12/2014 11/23/2012  Total Protein 6.0 - 8.5 g/dL 6.9 6.8  AST 0 - 40 IU/L 26 22  ALT 0 - 44 IU/L 29 20  Alk Phosphatase 39 - 117 IU/L 136(H) 107  Total Bilirubin 0.0 - 1.2 mg/dL 0.4 0.7  Bilirubin, Direct 0.00 - 0.40 mg/dL 0.11 0.17    No results found for: TSH No results found for: PSA  Assessment and Plan:   Healthcare maintenance - Plan: Basic  metabolic panel, Hepatic function panel, CBC with Differential/Platelet, Lipid panel, Multiple myeloma panel, serum, Kappa/lambda light chains  Family history of multiple myeloma - Plan: Basic metabolic panel, Hepatic function panel, CBC with Differential/Platelet, Lipid panel, Multiple myeloma panel, serum, Kappa/lambda light chains, CANCELED: Kappa/lambda light chains, CANCELED: Multiple myeloma panel, serum  Right ankle pain - Plan: Ambulatory referral to Orthopedic Surgery, Basic metabolic panel, Hepatic function panel, CBC with Differential/Platelet, Lipid panel, Multiple myeloma panel, serum, Kappa/lambda light chains  Status post ORIF of fracture of ankle - Plan: Ambulatory referral to Orthopedic Surgery, Basic metabolic panel, Hepatic function panel, CBC with Differential/Platelet, Lipid panel, Multiple myeloma panel, serum, Kappa/lambda light chains  Screening, lipid - Plan: Basic metabolic panel, Hepatic function panel, CBC with Differential/Platelet, Lipid panel, Multiple myeloma panel, serum, Kappa/lambda light chains, CANCELED: Lipid panel  Screening for diabetes  mellitus - Plan: Basic metabolic panel, Hepatic function panel, CBC with Differential/Platelet, Lipid panel, Multiple myeloma panel, serum, Kappa/lambda light chains, CANCELED: Basic metabolic panel  Other fatigue - Plan: Basic metabolic panel, Hepatic function panel, CBC with Differential/Platelet, Lipid panel, Multiple myeloma panel, serum, Kappa/lambda light chains, CANCELED: CBC with Differential/Platelet, CANCELED: Hepatic function panel  >10 minutes spent in face to face time with patient, >50% spent in counselling or coordination of care: right problems with his foot and ankle seem to be exacerbated with his current work situation.  He is frustrated at having such problems at an early age, and wants to talk about any kind of particular options that are for him.  With such extensive damage, I really think he needs to talk to a foot and ankle surgeon.  I'm going to consult Dr. Doran Durand for his opinion.  The patient has a high level of concern for potential spinal cancer, presumably multiple myeloma.  I explained to routine screening for multiple myeloma was not really typically done, but we can certainly do it if he wishes.  It is possible that this may not be covered by his insurance policy.  He indicated that he understood.  Initial testing appears negative.  He also is interested in allergy testing.  I gave him the name of Dr. Donneta Romberg.  Health Maintenance Exam: The patient's preventative maintenance and recommended screening tests for an annual wellness exam were reviewed in full today. Brought up to date unless services declined.  Counselled on the importance of diet, exercise, and its role in overall health and mortality. The patient's FH and SH was reviewed, including their home life, tobacco status, and drug and alcohol status.  Follow-up: No Follow-up on file. Unless noted, follow-up in 1 year for Health Maintenance Exam.  New Prescriptions   No medications on file   Orders Placed  This Encounter  Procedures  . Basic metabolic panel  . Hepatic function panel  . CBC with Differential/Platelet  . Lipid panel  . Multiple myeloma panel, serum  . Kappa/lambda light chains  . Ambulatory referral to Orthopedic Surgery    Signed,  Frederico Hamman T. Mickael Mcnutt, MD   Patient's Medications  New Prescriptions   No medications on file  Previous Medications   MULTIPLE VITAMIN (MULTIVITAMIN WITH MINERALS) TABS    Take 1 tablet by mouth daily.  Modified Medications   No medications on file  Discontinued Medications   No medications on file

## 2014-12-14 LAB — LIPID PANEL
CHOLESTEROL TOTAL: 177 mg/dL (ref 100–199)
Chol/HDL Ratio: 5.1 ratio units — ABNORMAL HIGH (ref 0.0–5.0)
HDL: 35 mg/dL — ABNORMAL LOW (ref >39)
LDL Calculated: 105 mg/dL — ABNORMAL HIGH (ref 0–99)
Triglycerides: 184 mg/dL — ABNORMAL HIGH (ref 0–149)
VLDL Cholesterol Cal: 37 mg/dL (ref 5–40)

## 2014-12-14 LAB — BASIC METABOLIC PANEL
BUN/Creatinine Ratio: 8 — ABNORMAL LOW (ref 9–20)
BUN: 9 mg/dL (ref 6–24)
CHLORIDE: 102 mmol/L (ref 97–108)
CO2: 24 mmol/L (ref 18–29)
Calcium: 9.1 mg/dL (ref 8.7–10.2)
Creatinine, Ser: 1.08 mg/dL (ref 0.76–1.27)
GFR calc Af Amer: 98 mL/min/{1.73_m2} (ref >59)
GFR calc non Af Amer: 85 mL/min/{1.73_m2} (ref >59)
Glucose: 77 mg/dL (ref 65–99)
Potassium: 4.1 mmol/L (ref 3.5–5.2)
Sodium: 143 mmol/L (ref 134–144)

## 2014-12-14 LAB — CBC WITH DIFFERENTIAL/PLATELET
BASOS ABS: 0 10*3/uL (ref 0.0–0.2)
Basos: 0 %
EOS (ABSOLUTE): 0.1 10*3/uL (ref 0.0–0.4)
Eos: 2 %
HEMOGLOBIN: 15.7 g/dL (ref 12.6–17.7)
Hematocrit: 45.5 % (ref 37.5–51.0)
IMMATURE GRANULOCYTES: 0 %
Immature Grans (Abs): 0 10*3/uL (ref 0.0–0.1)
LYMPHS ABS: 1.6 10*3/uL (ref 0.7–3.1)
Lymphs: 33 %
MCH: 29.2 pg (ref 26.6–33.0)
MCHC: 34.5 g/dL (ref 31.5–35.7)
MCV: 85 fL (ref 79–97)
Monocytes Absolute: 0.6 10*3/uL (ref 0.1–0.9)
Monocytes: 12 %
Neutrophils Absolute: 2.6 10*3/uL (ref 1.4–7.0)
Neutrophils: 53 %
Platelets: 216 10*3/uL (ref 150–379)
RBC: 5.38 x10E6/uL (ref 4.14–5.80)
RDW: 14 % (ref 12.3–15.4)
WBC: 4.8 10*3/uL (ref 3.4–10.8)

## 2014-12-14 LAB — HEPATIC FUNCTION PANEL
ALT: 29 IU/L (ref 0–44)
AST: 26 IU/L (ref 0–40)
Albumin: 4.1 g/dL (ref 3.5–5.5)
Alkaline Phosphatase: 136 IU/L — ABNORMAL HIGH (ref 39–117)
BILIRUBIN TOTAL: 0.4 mg/dL (ref 0.0–1.2)
Bilirubin, Direct: 0.11 mg/dL (ref 0.00–0.40)

## 2014-12-14 LAB — MULTIPLE MYELOMA PANEL, SERUM
ALPHA 1: 0.3 g/dL (ref 0.0–0.4)
ALPHA2 GLOB SERPL ELPH-MCNC: 0.6 g/dL (ref 0.4–1.0)
Albumin SerPl Elph-Mcnc: 3.6 g/dL (ref 2.9–4.4)
Albumin/Glob SerPl: 1.1 (ref 0.7–1.7)
B-GLOBULIN SERPL ELPH-MCNC: 1.2 g/dL (ref 0.7–1.3)
GAMMA GLOB SERPL ELPH-MCNC: 1.2 g/dL (ref 0.4–1.8)
Globulin, Total: 3.3 g/dL (ref 2.2–3.9)
IgA/Immunoglobulin A, Serum: 130 mg/dL (ref 90–386)
IgG (Immunoglobin G), Serum: 1228 mg/dL (ref 700–1600)
IgM (Immunoglobulin M), Srm: 56 mg/dL (ref 20–172)
Total Protein: 6.9 g/dL (ref 6.0–8.5)

## 2014-12-14 LAB — KAPPA/LAMBDA LIGHT CHAINS
IG KAPPA FREE LIGHT CHAIN: 19.12 mg/L (ref 3.30–19.40)
IG LAMBDA FREE LIGHT CHAIN: 13.07 mg/L (ref 5.71–26.30)
Kappa/Lambda FluidC Ratio: 1.46 (ref 0.26–1.65)

## 2014-12-19 ENCOUNTER — Encounter: Payer: Self-pay | Admitting: Family Medicine

## 2015-03-27 ENCOUNTER — Ambulatory Visit: Payer: 59 | Attending: Orthopedic Surgery | Admitting: Physical Therapy

## 2015-03-27 ENCOUNTER — Encounter: Payer: Self-pay | Admitting: Physical Therapy

## 2015-03-27 DIAGNOSIS — M79605 Pain in left leg: Secondary | ICD-10-CM | POA: Diagnosis not present

## 2015-03-27 DIAGNOSIS — R531 Weakness: Secondary | ICD-10-CM | POA: Insufficient documentation

## 2015-03-27 DIAGNOSIS — M79604 Pain in right leg: Secondary | ICD-10-CM | POA: Insufficient documentation

## 2015-03-28 NOTE — Therapy (Signed)
[facility] [address], [place], [address] Phone: [phone]   Fax:  [phone]  Physical Therapy Evaluation  Patient Details  Name: Derek Huffman MRN: [id number] Date of Birth: 1970/08/22 Referring Provider:  Wylene Simmer, MD  Encounter Date: 03/27/2015      PT End of Session - 03/27/15 0918    Visit Number 1   Number of Visits 8   Date for PT Re-Evaluation 04/24/15   PT Start Time 0818   PT Stop Time 0920   PT Time Calculation (min) 62 min   Activity Tolerance Patient tolerated treatment well   Behavior During Therapy [facility] for tasks assessed/performed      Past Medical History  Diagnosis Date  . Arthritis   . Family history of multiple myeloma 11/23/2012  . Allergic rhinitis due to pollen   . Decreased sensation of foot     Outer 3 toes    Past Surgical History  Procedure Laterality Date  . Fracture surgery Right   . Orif ankle fracture Right 1995-1997  . Orif tibia fracture Right 1995-1997  . Hip surgery Right   . Knee arthroscopy Right     There were no vitals filed for this visit.  Visit Diagnosis:  Right leg pain - Plan: PT plan of care cert/re-cert  Weakness generalized - Plan: PT plan of care cert/re-cert      Subjective Assessment - 03/27/15 0845    Subjective Currently patient reports he is annoyed because his right leg gives way when walking and is more frequent, 2-3 x/week his right ankle gives way and he does not fall all the time (will fall if he doesn't have something to hold onto).     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 He underwent surgery with ORIF with fusion. He reports he was originaly not supposed to be able tot walk at all. He was able to walk and has not been able to run fully again. He was able to walk without difficulty or leg giving  way up until  March 2016 just before he began to work again. Prior to this he reports he would wake up occasionally (about every 1-2 months) with numbness in his leg. No falls prior to April 2016. Was not working in a standing position from 2002 - 2016 (was doing office work and walking much less and  was not wearing steel toe boots. If he stands still he leans on something to balance.      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 more than 12-13   Has the patient had a decrease in activity level because of a fear of falling?  No   Is the patient reluctant to leave their home because of a fear of falling?  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, political party   Vocation Requirements standing  for up to 10 hours/day   Cognition   Overall Cognitive Status Within Functional Limits for tasks assessed      Objective: AROM: left hip, knee and ankle WNL's without pain, right hip WNL flexion, ER, limited ankle DF and Eversion and Inversion; unable to flex toes on right foot (digits 3-5)  Strength: right LE decreased hip flexion, abduction/ER, knee flexion and extension and ankle DF,  inversion/eversion and PF  Gait: ambulating without assistive device on level surfaces/stairs (further assessments to be performed next session)        PT Education - 03/27/15 0844    Education provided Yes   Person(s) Educated Patient instructed in exercises for home: ball rolling under right foot for ROM/ weight bearing; hip adduction with ball and glute sets x 10 reps, hip abduction with ER with resistive band x 15 reps in sitting, discussed fall prevention and safety with using cane outside the home and in unfamiliar settings, patient agreed to use cane and discussed use of hinged knee brace locked at 0 extension in order to prevent buckling of right knee to improve safety with walking and standing.   Methods Explanation;Verbal cues;Demonstration   Comprehension Verbalized understanding;Returned demonstration;Verbal cues required             PT Long Term Goals - 03/27/15 1530    PT LONG TERM GOAL #1   Title Patient will demonstrate improved function with decreased right LE knee/ankle pain demosntrated by LEFS score of 44/80 by 04/24/2015   Baseline LEFS 35/80   Status New   PT LONG TERM GOAL #2   Title Patient will demonstrate improved function with  decreased difficulty based on Ankle/foot disability index improved to 20% impairment or less by 04/24/2015   Baseline Ankle foot disability index 30%   Status New   PT LONG TERM  GOAL #3   Title Patient will be independent with home program without cuing for pain control and strength for right LE in order to improve function with standing and walking activties by 04/24/2015   Baseline no knowledge of appropriate exercises and pain control strategies to assist patient with improving function with daily activities at home and work   Status New               Plan - 03/27/15 0930    Clinical Impression Statement Patient is a 44 year old male who presents with long historey of right LE pain and weakness from injury sustained in 1995/1996.  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 He underwent surgery with ORIF with fusion. He reports he was originaly not supposed to be able tot walk at all. He was able to walk and has not been able to run fully again. He was able to walk without difficulty or leg giving way up until  March 2016 just before he began to work again. Patient has weakness and pain in right lower leg and ankle and has fallen many times since March 2016. He will benefit from physical therapy intervention to address limitations and weakness in order to improve safety with walking/community activities and work related tasks.     Pt will benefit from skilled therapeutic intervention in order to improve on the following deficits Difficulty walking;Increased muscle spasms;Pain;Decreased strength   Rehab Potential Fair   Clinical Impairments Affecting Rehab Potential (+) motivated, age (-) chronic condition, unkown cause for giving way of LE   PT Frequency 2x / week   PT Duration 4 weeks   PT Treatment/Interventions Therapeutic exercise;Electrical Stimulation;Dry needling;Patient/family education;Manual techniques   Consulted and Agree with Plan of Care Patient         Problem List Patient Active Problem List   Diagnosis Date Noted  . Carpal tunnel syndrome, left  10/10/2014  . Numbness and tingling in left hand 05/13/2014  . Left arm pain 05/13/2014  . Allergic rhinitis 11/24/2012  . Decreased sensation of foot   . Family history of multiple myeloma 11/23/2012    Jomarie Longs PT 03/28/2015, 3:42 PM  [place] [facility] [address] [place], [address] Phone: [phone]   Fax:  [phone]

## 2015-03-31 ENCOUNTER — Ambulatory Visit: Payer: 59 | Admitting: Physical Therapy

## 2015-03-31 ENCOUNTER — Encounter: Payer: Self-pay | Admitting: Physical Therapy

## 2015-03-31 DIAGNOSIS — R531 Weakness: Secondary | ICD-10-CM

## 2015-03-31 DIAGNOSIS — M79604 Pain in right leg: Secondary | ICD-10-CM

## 2015-03-31 NOTE — Therapy (Signed)
[place] [facility] [address], [place], [address] Phone: [phone]   Fax:  [phone]  Physical Therapy Treatment  Patient Details  Name: Derek Huffman MRN: [id number] Date of Birth: August 24, 1970 Referring Provider:  Wylene Simmer, MD  Encounter Date: 03/31/2015      PT End of Session - 03/31/15 0849    Visit Number 2   Number of Visits 8   Date for PT Re-Evaluation 04/24/15   PT Start Time 0818   PT Stop Time 0848   PT Time Calculation (min) 30 min   Activity Tolerance Patient tolerated treatment well   Behavior During Therapy [facility] for tasks assessed/performed      Past Medical History  Diagnosis Date  . Arthritis   . Family history of multiple myeloma 11/23/2012  . Allergic rhinitis due to pollen   . Decreased sensation of foot     Outer 3 toes    Past Surgical History  Procedure Laterality Date  . Fracture surgery Right   . Orif ankle fracture Right 1995-1997  . Orif tibia fracture Right 1995-1997  . Hip surgery Right   . Knee arthroscopy Right     There were no vitals filed for this visit.  Visit Diagnosis:  Weakness generalized  Right leg pain      Subjective Assessment - 03/31/15 0827    Subjective Patient reports no falls since last session.    Limitations Standing;Walking   Patient Stated Goals Patient would like to be able to walk and stand with less difficulty/pain and prevent falls   Currently in Pain? No/denies           [facility] Adult PT Treatment/Exercise - 03/31/15 0828    Exercises   Exercises Other Exercises   Other Exercises  Therapist instructed patient in exercises with verbal cuing and demonstration: ankle exercises with resistive band for DF, PF, eversion and inversion 15 reps performed each direction, standing hip exercise on rotary hip machine with assistance of therapist to set up with proper alignment and intensity: hip adduction with 100# x 15 reps, hip  extension 100# for left and 85# right hip x 15 reps, hip abduction 55# x 15 reps each     Patient response to treatment: reported fatigue following hip exercises and recognized weakness in right LE as compare to left. Required verbal cuing and assistance for all exercises, improved control and reported noticing a difference with walking after exercises today       PT Education - 03/31/15 0850    Education provided Yes   Education Details instructed in ankle resistive exercises and to keep track of any symptom or sign that occurs prior to his right leg going numb or giving way   Person(s) Educated Patient   Methods Explanation;Demonstration;Verbal cues;Handout   Comprehension Verbalized understanding;Returned demonstration;Verbal cues required             PT Long Term Goals - 03/27/15 1530    PT LONG TERM GOAL #1   Title Patient will demonstrate improved function with decreased right LE knee/ankle pain demosntrated by LEFS score of 44/80 by 04/24/2015   Baseline LEFS 35/80   Status New   PT LONG TERM GOAL #2   Title Patient will demonstrate improved function with  decreased difficulty based on Ankle/foot disability index improved to 20% impairment or less by 04/24/2015   Baseline Ankle foot disability index 30%   Status New   PT LONG TERM GOAL #3  Title Patient will be independent with home program without cuing for pain control and strength for right LE in order to improve function with standing and walking activties by 04/24/2015   Baseline no knowledge of appropriate exercises and pain control strategies to assist patient with improving function with daily activities at home and work   Status New               Plan - 03/31/15 0851    Clinical Impression Statement advanced exercises for hip and ankles with red resistive band and rotary hip machine with good results of patient noticing a positive change in the way his right LE felt.    Pt will benefit from skilled  therapeutic intervention in order to improve on the following deficits Difficulty walking;Increased muscle spasms;Pain;Decreased strength   Rehab Potential Fair   PT Frequency 2x / week   PT Duration 4 weeks   PT Treatment/Interventions Therapeutic exercise;Electrical Stimulation;Dry needling;Patient/family education;Manual techniques   PT Next Visit Plan continue to progress exercises for strengthening and stability: add  Ball tossing and leg press with ball next session   PT Home Exercise Plan as instructed for ankle strengthening with resistive band and continue other stabilization exercises        Problem List Patient Active Problem List   Diagnosis Date Noted  . Carpal tunnel syndrome, left 10/10/2014  . Numbness and tingling in left hand 05/13/2014  . Left arm pain 05/13/2014  . Allergic rhinitis 11/24/2012  . Decreased sensation of foot   . Family history of multiple myeloma 11/23/2012    Jomarie Longs PT 03/31/2015, 9:43 PM  [place] [facility] [address], [place], [address] Phone: [phone]   Fax:  [phone]

## 2015-04-03 ENCOUNTER — Ambulatory Visit: Payer: 59 | Admitting: Physical Therapy

## 2015-04-07 ENCOUNTER — Encounter: Payer: Self-pay | Admitting: Physical Therapy

## 2015-04-07 ENCOUNTER — Ambulatory Visit: Payer: 59 | Admitting: Physical Therapy

## 2015-04-07 DIAGNOSIS — R531 Weakness: Secondary | ICD-10-CM

## 2015-04-07 DIAGNOSIS — M79604 Pain in right leg: Secondary | ICD-10-CM | POA: Diagnosis not present

## 2015-04-07 NOTE — Therapy (Signed)
[place] [facility] [address] [place], [address] Phone: [phone]   Fax:  [phone]  Physical Therapy Treatment  Patient Details  Name: Derek Huffman MRN: [id number] Date of Birth: 24-Aug-1970 No Data Recorded  Encounter Date: 04/07/2015      PT End of Session - 04/07/15 1000    Visit Number 3   Number of Visits 8   Date for PT Re-Evaluation 04/24/15   PT Start Time 0805   PT Stop Time 0905   PT Time Calculation (min) 60 min   Activity Tolerance Patient tolerated treatment well   Behavior During Therapy [facility] for tasks assessed/performed      Past Medical History  Diagnosis Date  . Arthritis   . Family history of multiple myeloma 11/23/2012  . Allergic rhinitis due to pollen   . Decreased sensation of foot     Outer 3 toes    Past Surgical History  Procedure Laterality Date  . Fracture surgery Right   . Orif ankle fracture Right 1995-1997  . Orif tibia fracture Right 1995-1997  . Hip surgery Right   . Knee arthroscopy Right     There were no vitals filed for this visit.  Visit Diagnosis:  Weakness generalized  Right leg pain      Subjective Assessment - 04/07/15 0809    Subjective Patient reports he is still having sharp pains in the morninng on standing. He has not fallen since last session. He reports the sharp pain in his leg in the morning is a new symptom and he only feels the tingling/sharp pain for ~ 5 min. He is exercising as instructed and feels the exercises are helping   Limitations Standing;Walking   Patient Stated Goals Patient would like to be able to walk and stand with less difficulty/pain and prevent falls   Currently in Pain? No/denies   Multiple Pain Sites No      Objective:  Strength: decreased right ankle eversion/inversion 4-/5       [facility] Adult PT Treatment/Exercise - 04/07/15 0813    Exercises   Exercises Other Exercises   Other Exercises  Exercises  performed with assistance and guidance of physical therapist: ankle exercises with resistive band for DF, PF, eversion and inversion 15 reps each direction with assistance of therapist for correct position/technique, seated hip abduction with doubled blue resistive band x 15 reps, standing hip resistive exercises on Matrix rotary hip machine for hip adduction and extension @ 100#, hip abduction @ 70# x 15 reps each, each LE with verbal cues and demonstration for correct positioning, leg press 65# 2 x 15 reps with assistance to position LEs and with ball between knees to align hip/knee and ankle correctly, balance system for weight shifting x 2 min. Forward/back and side to side, single leg balance with one foot on BOSU ball 4# ball tossed at pitch back x 15 reps for each LE.  Walking up/down ramp holding (2) 7# weights x 8 reps with guidance and cuing NuStep (unbilled) x 10 min. Level #2 workload for weight shifting and endurance      Patient response to treatment: Patient able to perform all exercises with mild fatigue and no feeling of leg giving way, he required verbal cuing and demonstration to perform all exercises with good alignment and technique          PT Education - 04/07/15 0959    Education provided Yes   Education Details instructed to  continue with home program and added walking with weights up/down ramp   Person(s) Educated Patient   Methods Explanation;Demonstration;Verbal cues   Comprehension Verbalized understanding;Returned demonstration;Verbal cues required             PT Long Term Goals - 03/27/15 1530    PT LONG TERM GOAL #1   Title Patient will demonstrate improved function with decreased right LE knee/ankle pain demosntrated by LEFS score of 44/80 by 04/24/2015   Baseline LEFS 35/80   Status New   PT LONG TERM GOAL #2   Title Patient will demonstrate improved function with  decreased difficulty based on Ankle/foot disability index improved to 20% impairment or  less by 04/24/2015   Baseline Ankle foot disability index 30%   Status New   PT LONG TERM GOAL #3   Title Patient will be independent with home program without cuing for pain control and strength for right LE in order to improve function with standing and walking activties by 04/24/2015   Baseline no knowledge of appropriate exercises and pain control strategies to assist patient with improving function with daily activities at home and work   Status New               Plan - 04/07/15 1001    Clinical Impression Statement Patient was able to perform exercises with verbal cuing and assistance for proper alignment of hip/knee and ankle for all exercises. He has not has an episode of falling since beginning physical therapy and should continue to progress with addidtional intervention.   Pt will benefit from skilled therapeutic intervention in order to improve on the following deficits Difficulty walking;Increased muscle spasms;Pain;Decreased strength   Rehab Potential Fair   PT Frequency 2x / week   PT Duration 4 weeks   PT Treatment/Interventions Therapeutic exercise;Electrical Stimulation;Dry needling;Patient/family education;Manual techniques   PT Next Visit Plan continue to progress exercises for strengthening and stability        Problem List Patient Active Problem List   Diagnosis Date Noted  . Carpal tunnel syndrome, left 10/10/2014  . Numbness and tingling in left hand 05/13/2014  . Left arm pain 05/13/2014  . Allergic rhinitis 11/24/2012  . Decreased sensation of foot   . Family history of multiple myeloma 11/23/2012    Derek Huffman PT 04/07/2015, 10:30 AM  [place] [facility] [address], [place], [address] Phone: [phone]   Fax:  [phone]  Name: Derek Huffman MRN: [id number] Date of Birth: 1971-01-04

## 2015-04-10 ENCOUNTER — Encounter: Payer: Self-pay | Admitting: Physical Therapy

## 2015-04-10 ENCOUNTER — Ambulatory Visit: Payer: 59 | Admitting: Physical Therapy

## 2015-04-10 DIAGNOSIS — R531 Weakness: Secondary | ICD-10-CM

## 2015-04-10 DIAGNOSIS — M79604 Pain in right leg: Secondary | ICD-10-CM | POA: Diagnosis not present

## 2015-04-10 NOTE — Therapy (Signed)
[place] [facility] [address] [place], [address] Phone: [phone]   Fax:  [phone]  Physical Therapy Treatment  Patient Details  Name: Derek Huffman MRN: [id number] Date of Birth: [birth year] No Data Recorded  Encounter Date: 04/10/2015      PT End of Session - 04/10/15 2836    Visit Number 4   Number of Visits 8   Date for PT Re-Evaluation 04/24/15   PT Start Time 0805   PT Stop Time 6294   PT Time Calculation (min) 50 min   Activity Tolerance Patient tolerated treatment well   Behavior During Therapy [facility] for tasks assessed/performed      Past Medical History  Diagnosis Date  . Arthritis   . Family history of multiple myeloma 11/23/2012  . Allergic rhinitis due to pollen   . Decreased sensation of foot     Outer 3 toes    Past Surgical History  Procedure Laterality Date  . Fracture surgery Right   . Orif ankle fracture Right 1995-1997  . Orif tibia fracture Right 1995-1997  . Hip surgery Right   . Knee arthroscopy Right     There were no vitals filed for this visit.  Visit Diagnosis:  Weakness generalized  Right leg pain      Subjective Assessment - 04/10/15 0810    Subjective Patient reports he is still having sharp pains in the morning on standing. He has not fallen since last session. He reports the sharp pain in his leg in the morning is a new symptom and he only feels the tingling/sharp pain 5 seconds. He has not worn his steel toe boots for the past 1 1/2 weeks and feels this may have been a contributing factor to his problem.    Patient Stated Goals Patient would like to be able to walk and stand with less difficulty/pain and prevent falls   Currently in Pain? No/denies      Objective: Gait and balance WNL's on arrival Strength decreased hip abduction, ER right LE as compared to left and noted during hip exercises.        [facility] Adult PT Treatment/Exercise - 04/10/15  0814    Exercises   Exercises Other Exercises   Other Exercises  standing hip resistive exercises on Matrix rotary hip machine for hip adduction and extension @ 100#, hip abduction @ 70# x 15 reps each, each LE with verbal cues and demonstration for correct positioning, leg press 65#  And 75# 1 x 15 reps each with assistance to position LEs and with ball between knees to align hip/knee and ankle correctly, balance system for weight shifting x 2 min. Forward/back and side to side, limits of stability with platform unlocked at #7 3 x (30 seconds to perform at level up to 85% accuracy) single leg balance with one foot on BOSU ball 6# ball tossed at pitch back x 15 reps for each LE. Walking on diagonal (wedding march) holding (2) 7# weights x 2 min.with guidance and cuing NuStep (unbilled) x 10 min. Level #2 workload for weight shifting and endurance       Patient response to treatment: improved balance on balance system to 85% accuracy in 30 seconds over 3 repetitions. Patient required assistance to perform all exercises with correct positioning and technique, no loss of balance noted during any exercises         PT Long Term Goals - 03/27/15 1530    PT LONG  TERM GOAL #1   Title Patient will demonstrate improved function with decreased right LE knee/ankle pain demosntrated by LEFS score of 44/80 by 04/24/2015   Baseline LEFS 35/80   Status New   PT LONG TERM GOAL #2   Title Patient will demonstrate improved function with  decreased difficulty based on Ankle/foot disability index improved to 20% impairment or less by 04/24/2015   Baseline Ankle foot disability index 30%   Status New   PT LONG TERM GOAL #3   Title Patient will be independent with home program without cuing for pain control and strength for right LE in order to improve function with standing and walking activties by 04/24/2015   Baseline no knowledge of appropriate exercises and pain control strategies to assist patient with  improving function with daily activities at home and work   Ko [place] - 04/10/15 2179    Clinical Impression Statement Patient is progressing with therapy with reports of not having an episode of giving way or falling due to right LE since beginning physical therapy intervention. He has not worn steel toe boots in the past 1 1/2 weeks and will see if that has made a difference as well. He is going back to work tomorrow and will be wearing them again.    Pt will benefit from skilled therapeutic intervention in order to improve on the following deficits Difficulty walking;Increased muscle spasms;Pain;Decreased strength   Rehab Potential Fair   PT Frequency 2x / week   PT Duration 4 weeks   PT Treatment/Interventions Therapeutic exercise;Electrical Stimulation;Dry needling;Patient/family education;Manual techniques   PT Next Visit Plan continue to progress exercises for strengthening and stability        Problem List Patient Active Problem List   Diagnosis Date Noted  . Carpal tunnel syndrome, left 10/10/2014  . Numbness and tingling in left hand 05/13/2014  . Left arm pain 05/13/2014  . Allergic rhinitis 11/24/2012  . Decreased sensation of foot   . Family history of multiple myeloma 11/23/2012    Jomarie Longs PT 04/10/2015, 1:42 PM  [place] [facility] [address], [place], [address] Phone: [phone]   Fax:  [phone]  Name: Derek Huffman MRN: [id number] Date of Birth: 24-Mar-1971

## 2015-04-14 ENCOUNTER — Ambulatory Visit: Payer: 59 | Admitting: Physical Therapy

## 2015-04-14 ENCOUNTER — Encounter: Payer: Self-pay | Admitting: Physical Therapy

## 2015-04-14 DIAGNOSIS — M79604 Pain in right leg: Secondary | ICD-10-CM | POA: Diagnosis not present

## 2015-04-14 DIAGNOSIS — R531 Weakness: Secondary | ICD-10-CM

## 2015-04-14 NOTE — Therapy (Signed)
[facility] [address] [place], [address] Phone: [phone]   Fax:  [phone]  Physical Therapy Treatment  Patient Details  Name: Derek Huffman MRN: [id number] Date of Birth: March 10, 1971 Referring Provider: Wylene Simmer, MD  Encounter Date: 04/14/2015      PT End of Session - 04/14/15 0906    Visit Number 5   Number of Visits 8   Date for PT Re-Evaluation 04/24/15   PT Start Time 0810   PT Stop Time 0900   PT Time Calculation (min) 50 min   Activity Tolerance Patient tolerated treatment well   Behavior During Therapy [facility] for tasks assessed/performed      Past Medical History  Diagnosis Date  . Arthritis   . Family history of multiple myeloma 11/23/2012  . Allergic rhinitis due to pollen   . Decreased sensation of foot     Outer 3 toes    Past Surgical History  Procedure Laterality Date  . Fracture surgery Right   . Orif ankle fracture Right 1995-1997  . Orif tibia fracture Right 1995-1997  . Hip surgery Right   . Knee arthroscopy Right     There were no vitals filed for this visit.  Visit Diagnosis:  Weakness generalized  Right leg pain      Subjective Assessment - 04/14/15 0812    Subjective Patient reports he went back to work and had a few episodes of pain in right lower leg with feeling like he was going to fall but did not.    Limitations Standing;Walking   Patient Stated Goals Patient would like to be able to walk and stand with less difficulty/pain and prevent falls   Currently in Pain? No/denies               [facility] Adult PT Treatment/Exercise - 04/14/15 0813    Exercises   Exercises Other Exercises   Other Exercises  Exercises performed with assistance and guidance of physical therapist: ankle exercises with resistive band for DF, PF, eversion and inversion 15 reps each direction with assistance of therapist for correct position/technique, seated hip  abduction with doubled blue resistive band x 15 reps, standing hip resistive exercises on Matrix rotary hip machine for hip adduction and extension @ 100#, hip abduction @ 70# x 15 reps each, each LE with verbal cues and demonstration for correct positioning, leg press 65# 1 x 15 reps and 75# x 15 reps  with assistance to position LEs and with ball between knees to align hip/knee and ankle correctly, balance system for weight shifting limits of stability with platform unlocked single leg balance with one foot on BOSU ball 4# ball tossed at pitch back x 15 reps for each LE. Walking on a diagonal walk holding (2) 7# weights x 2 min. with guidance and cuing NuStep (unbilled) x 10 min. Level #2 workload for weight shifting and endurance         Patient response to treatment: improved balance on balance system to 95% accuracy in 30 seconds or less over 3 repetitions. Patient required assistance to perform all exercises with correct positioning and technique, no loss of balance noted during any exercises         PT Education - 04/14/15 1449    Education provided Yes   Education Details Instructed to use heat at home and elevate LE following work to see if this helps alleviate symptoms in right lower leg   Person(s) Educated  Patient   Methods Explanation   Comprehension Verbalized understanding             PT Long Term Goals - 03/27/15 1530    PT LONG TERM GOAL #1   Title Patient will demonstrate improved function with decreased right LE knee/ankle pain demosntrated by LEFS score of 44/80 by 04/24/2015   Baseline LEFS 35/80   Status New   PT LONG TERM GOAL #2   Title Patient will demonstrate improved function with  decreased difficulty based on Ankle/foot disability index improved to 20% impairment or less by 04/24/2015   Baseline Ankle foot disability index 30%   Status New   PT LONG TERM GOAL #3   Title Patient will be independent with home program without cuing for pain control and  strength for right LE in order to improve function with standing and walking activties by 04/24/2015   Baseline no knowledge of appropriate exercises and pain control strategies to assist patient with improving function with daily activities at home and work   [place] - 04/14/15 0907    Clinical Impression Statement Patient is progressing well with strength and stability exercises without loss of balance or leg giving way during therapy sessions. He is noticiing a correlation between wearing steel toe boots and feeling like he is going to fall during work day and afterwards. He is going to try elevating and using heat to his lower leg following work day and see if that helps to alleviate his symptoms.    Pt will benefit from skilled therapeutic intervention in order to improve on the following deficits Difficulty walking;Increased muscle spasms;Pain;Decreased strength   Rehab Potential Fair   PT Frequency 2x / week   PT Duration 4 weeks   PT Treatment/Interventions Therapeutic exercise;Electrical Stimulation;Dry needling;Patient/family education;Manual techniques   PT Next Visit Plan continue to progress exercises for strengthening and stability   PT Home Exercise Plan elevate and use heat following standing at work in [employer]        Problem List Patient Active Problem List   Diagnosis Date Noted  . Carpal tunnel syndrome, left 10/10/2014  . Numbness and tingling in left hand 05/13/2014  . Left arm pain 05/13/2014  . Allergic rhinitis 11/24/2012  . Decreased sensation of foot   . Family history of multiple myeloma 11/23/2012    Jomarie Longs PT 04/15/2015, 12:47 PM  [facility] [address] [place], [address] Phone: [phone]   Fax:  [phone]  Name: ABDUL BEIRNE MRN: [id number] Date of Birth: 09-Aug-1970

## 2015-04-17 ENCOUNTER — Encounter: Payer: Self-pay | Admitting: Physical Therapy

## 2015-04-17 ENCOUNTER — Ambulatory Visit: Payer: 59 | Admitting: Physical Therapy

## 2015-04-17 DIAGNOSIS — M79604 Pain in right leg: Secondary | ICD-10-CM

## 2015-04-17 DIAGNOSIS — R531 Weakness: Secondary | ICD-10-CM

## 2015-04-17 NOTE — Therapy (Signed)
[facility] [address], [place], [address] Phone: [phone]   Fax:  [phone]  Physical Therapy Treatment  Patient Details  Name: Derek Huffman MRN: [id number] Date of Birth: 11-23-1970 Referring Provider: Wylene Simmer, MD  Encounter Date: 04/17/2015      PT End of Session - 04/17/15 0823    Visit Number 6   Number of Visits 8   Date for PT Re-Evaluation 04/24/15   PT Start Time 0814   PT Stop Time 0900   PT Time Calculation (min) 46 min   Activity Tolerance Patient tolerated treatment well   Behavior During Therapy [facility] for tasks assessed/performed      Past Medical History  Diagnosis Date  . Arthritis   . Family history of multiple myeloma 11/23/2012  . Allergic rhinitis due to pollen   . Decreased sensation of foot     Outer 3 toes    Past Surgical History  Procedure Laterality Date  . Fracture surgery Right   . Orif ankle fracture Right 1995-1997  . Orif tibia fracture Right 1995-1997  . Hip surgery Right   . Knee arthroscopy Right     There were no vitals filed for this visit.  Visit Diagnosis:  Weakness generalized  Right leg pain      Subjective Assessment - 04/17/15 0816    Subjective No falls since beginning physical therapy. He has not been at work since last week and will return to work tomorrow.    Limitations Standing;Walking   Patient Stated Goals Patient would like to be able to walk and stand with less difficulty/pain and prevent falls   Currently in Pain? Yes   Pain Score 2    Pain Location Leg   Pain Orientation Right   Pain Descriptors / Indicators Burning;Aching   Pain Onset More than a month ago   Pain Frequency Intermittent       Objective: Observation: posture: WNL's for standing       [facility] Adult PT Treatment/Exercise - 04/17/15 0819    Exercises   Exercises Other Exercises   Other Exercises  ( not performed today due to pateint arriving  late to therapy session: ankle exercises with resistive band for DF, PF, eversion and inversion 15 reps each direction with assistance of therapist for correct position/technique, seated hip abduction with doubled blue resistive band x 15 reps, balance system exercises) Exercises performed with assistance and guidance of physical therapist: standing hip resistive exercises on Matrix rotary hip machine for hip adduction and extension @ 130#, hip abduction @ 70# x 15 reps each, each LE with verbal cues and demonstration for correct positioning, leg press 65# 1 x 15 reps and 75# x 15 reps with assistance to position LEs and with ball between knees to align hip/knee and ankle correctly, single leg balance with one foot on BOSU ball 4# ball tossed at pitch back x 15 reps for each LE. Walking on a diagonal walk holding (2) 7# weights x 2 min. with guidance and cuing NuStep (unbilled) x 10 min. Level #2 workload for weight shifting and endurance         Patient response to treatment: improved balance noted with single leg ball toss/stand without loss of balance throughout session.  Patient required assistance to perform all exercises with correct positioning and technique, increased intensity for hip exercises in standing from 100# to 130#          PT Long  Term Goals - 03/27/15 1530    PT LONG TERM GOAL #1   Title Patient will demonstrate improved function with decreased right LE knee/ankle pain demosntrated by LEFS score of 44/80 by 04/24/2015   Baseline LEFS 35/80   Status New   PT LONG TERM GOAL #2   Title Patient will demonstrate improved function with  decreased difficulty based on Ankle/foot disability index improved to 20% impairment or less by 04/24/2015   Baseline Ankle foot disability index 30%   Status New   PT LONG TERM GOAL #3   Title Patient will be independent with home program without cuing for pain control and strength for right LE in order to improve function with standing and  walking activties by 04/24/2015   Baseline no knowledge of appropriate exercises and pain control strategies to assist patient with improving function with daily activities at home and work   Status New               Plan - 04/17/15 2111    Clinical Impression Statement Patient is progressing well with strength and stability. He has not had any incidences of falls since beginning physical therapy. He arrived 15 min. late today and therefore had limited time for exercise.    Pt will benefit from skilled therapeutic intervention in order to improve on the following deficits Difficulty walking;Increased muscle spasms;Pain;Decreased strength   Rehab Potential Fair   PT Frequency 2x / week   PT Duration 4 weeks   PT Treatment/Interventions Therapeutic exercise;Electrical Stimulation;Dry needling;Patient/family education;Manual techniques   PT Next Visit Plan continue to progress exercises for strengthening and stability   PT Home Exercise Plan continue with ankle exercise with resistive band and stabilzation exercises with resistive band walking and hip extension and abduction in standing as instructed    e    Problem List Patient Active Problem List   Diagnosis Date Noted  . Carpal tunnel syndrome, left 10/10/2014  . Numbness and tingling in left hand 05/13/2014  . Left arm pain 05/13/2014  . Allergic rhinitis 11/24/2012  . Decreased sensation of foot   . Family history of multiple myeloma 11/23/2012    Jomarie Longs PT 04/17/2015, 11:28 AM  [facility] [address] [place], [address] Phone: [phone]   Fax:  [phone]  Name: Derek Huffman MRN: [id number] Date of Birth: 1970/07/28

## 2015-04-21 ENCOUNTER — Ambulatory Visit: Payer: 59 | Admitting: Physical Therapy

## 2015-04-21 ENCOUNTER — Encounter: Payer: Self-pay | Admitting: Physical Therapy

## 2015-04-21 DIAGNOSIS — M79604 Pain in right leg: Secondary | ICD-10-CM

## 2015-04-21 DIAGNOSIS — R531 Weakness: Secondary | ICD-10-CM

## 2015-04-21 NOTE — Therapy (Signed)
[facility] [address], [place], [address] Phone: [phone]   Fax:  [phone]  Physical Therapy Treatment/Discharge Summary  Patient Details  Name: Derek Huffman MRN: [id number] Date of Birth: 1971-06-16 Referring Provider: Wylene Simmer MD  Encounter Date: 04/21/2015   Patient began physical therapy 01/25/2015 and attended 7 sessions through 04/20/15. He has achieved  Goals and is independent with self management and home exercise program  Discharge from physical therapy at this time      PT End of Session - 04/21/15 0824    Visit Number 7   Number of Visits 8   Date for PT Re-Evaluation 04/24/15   PT Start Time 0816   PT Stop Time 0900   PT Time Calculation (min) 44 min   Activity Tolerance Patient tolerated treatment well   Behavior During Therapy [facility] for tasks assessed/performed      Past Medical History  Diagnosis Date  . Arthritis   . Family history of multiple myeloma 11/23/2012  . Allergic rhinitis due to pollen   . Decreased sensation of foot     Outer 3 toes    Past Surgical History  Procedure Laterality Date  . Fracture surgery Right   . Orif ankle fracture Right 1995-1997  . Orif tibia fracture Right 1995-1997  . Hip surgery Right   . Knee arthroscopy Right     There were no vitals filed for this visit.  Visit Diagnosis:  Weakness generalized  Right leg pain      Subjective Assessment - 04/21/15 0818    Subjective Has gone to the local gym and looked at the exercise machines. He reports that standing at work for long periods of time and at end of the day he felt his leg almost gave way. patient reports he does not have the tingling in his right foot on waking now and feels he is ready to continue on own with exercise.   Limitations Standing;Walking   Patient Stated Goals Patient would like to be able to walk and stand with less difficulty/pain and prevent falls   Currently in Pain? No/denies      Objective: Outcome measures: LEFS 62/80  (80 = no self perceived disability)  (22% impaired), ankle foot disability index 28% self perceived impairment  AROM and strength right LE WFL's with improved ability to stand and work with significant decrease in symptoms and no reports of falls since beginning physical therpay Gait: WNL's without deviations        [facility] Adult PT Treatment/Exercise - 04/21/15 0823    Exercises   Exercises Other Exercises   Other Exercises  Exercises performed with assistance and guidance of physical therapist: standing hip resistive exercises on Matrix rotary hip machine for hip adduction and extension @ 130#, hip abduction @ 70# x 15 reps each, each LE with verbal cues and demonstration for correct positioning, leg press 65# 1 x 20 reps and 75# x 15 reps with assistance to position LEs and with ball between knees to align hip/knee and ankle correctly, single leg balance with one foot on BOSU ball 6# ball tossed at pitch back x 15 reps for each LE. Walking on a diagonal walk holding (2) 8# weights x 3 min. with guidance and cuing NuStep (unbilled) x 10 min. Level #3 workload for weight shifting and endurance ,        Patient response to treatment: improved balance noted with single leg ball toss/stand without  loss of balance throughout session. Patient required minimal assistance to perform all exercises with correct positioning and technique, demonstrated good understanding of home program and transition to gym exercises        PT Education - 04/21/15 0850    Education provided Yes   Education Details Instructed in exercises to continue at home and when going to local fitness facility   Person(s) Educated Patient   Methods Explanation   Comprehension Verbalized understanding             PT Long Term Goals - 04/21/15 5456    PT LONG TERM GOAL #1   Title Patient will demonstrate improved function with decreased  right LE knee/ankle pain demosntrated by LEFS score of 44/80 by 04/24/2015   Baseline LEFS 35/80 (04/21/15 LEFS  62/80)   Status Achieved   PT LONG TERM GOAL #2   Title Patient will demonstrate improved function with  decreased difficulty based on Ankle/foot disability index improved to 20% impairment or less by 04/24/2015   Baseline Ankle foot disability index 30% (04/20/2015 28%) (unable to perform recreational activties)    Status Partially Met   PT LONG TERM GOAL #3   Title Patient will be independent with home program without cuing for pain control and strength for right LE in order to improve function with standing and walking activties by 04/24/2015   Baseline no knowledge of appropriate exercises and pain control strategies to assist patient with improving function with daily activities at home and work   Status Achieved               Plan - 04/21/15 1312    Clinical Impression Statement Patient demonstrates good understanding of exercises and safety awareness for standing and walking activties. He has improved strength and function with decreased paresthesias in his right foot on waking in the morning. He has achieved all goals and recommend discharge from physical therapy at this time with continued self managemen with home program.    Pt will benefit from skilled therapeutic intervention in order to improve on the following deficits Difficulty walking;Increased muscle spasms;Pain;Decreased strength   Rehab Potential Fair   PT Frequency 2x / week   PT Duration 4 weeks   PT Treatment/Interventions Therapeutic exercise;Electrical Stimulation;Dry needling;Patient/family education;Manual techniques   PT Next Visit Plan discharge from physical therapy    Consulted and Agree with Plan of Care Patient        Problem List Patient Active Problem List   Diagnosis Date Noted  . Carpal tunnel syndrome, left 10/10/2014  . Numbness and tingling in left hand 05/13/2014  . Left arm  pain 05/13/2014  . Allergic rhinitis 11/24/2012  . Decreased sensation of foot   . Family history of multiple myeloma 11/23/2012    Jomarie Longs PT 04/21/2015, 1:17 PM  [facility] [address] [place], [address] Phone: [phone]   Fax:  [phone]  Name: Derek Huffman MRN: [id number] Date of Birth: Nov 19, 1970

## 2015-04-24 ENCOUNTER — Encounter: Payer: 59 | Admitting: Physical Therapy

## 2015-04-28 ENCOUNTER — Ambulatory Visit: Payer: 59 | Admitting: Physical Therapy

## 2015-05-01 ENCOUNTER — Encounter: Payer: 59 | Admitting: Physical Therapy

## 2015-05-05 ENCOUNTER — Encounter: Payer: 59 | Admitting: Physical Therapy

## 2015-05-08 ENCOUNTER — Encounter: Payer: 59 | Admitting: Physical Therapy

## 2015-05-12 ENCOUNTER — Encounter: Payer: 59 | Admitting: Physical Therapy

## 2015-05-15 ENCOUNTER — Encounter: Payer: 59 | Admitting: Physical Therapy

## 2015-05-22 ENCOUNTER — Encounter: Payer: Self-pay | Admitting: Physical Therapy

## 2015-06-19 ENCOUNTER — Encounter: Payer: Self-pay | Admitting: Family Medicine

## 2016-05-02 ENCOUNTER — Ambulatory Visit (INDEPENDENT_AMBULATORY_CARE_PROVIDER_SITE_OTHER): Payer: 59 | Admitting: Family Medicine

## 2016-05-02 ENCOUNTER — Encounter: Payer: Self-pay | Admitting: Family Medicine

## 2016-05-02 VITALS — BP 108/80 | HR 57 | Temp 97.6°F | Ht 71.0 in | Wt 245.8 lb

## 2016-05-02 DIAGNOSIS — Z23 Encounter for immunization: Secondary | ICD-10-CM

## 2016-05-02 DIAGNOSIS — L603 Nail dystrophy: Secondary | ICD-10-CM

## 2016-05-02 NOTE — Progress Notes (Signed)
Pre visit review using our clinic review tool, if applicable. No additional management support is needed unless otherwise documented below in the visit note. 

## 2016-05-02 NOTE — Progress Notes (Signed)
Dr. Frederico Hamman T. Trayven Lumadue, MD, [facility] [place], [address] Phone: [phone] Fax: [phone]  05/02/2016  Patient: Derek Huffman, MRN: [id number], DOB: 1971/06/15, 45 y.o.  Primary Physician:  Owens Loffler, MD   Chief Complaint  Patient presents with  . Nails Splitting    Both Big Toes   Subjective:   Derek Huffman is a 45 y.o. 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  He does move up the size and a half see if this would help, this is persistent.  His feet do get wet sometimes when he is at work.  He has tried nothing at home.  He does have a history of multiple ingrown toenails in the past, but none currently.  Past Medical History, Surgical History, Social History, Family History, Problem List, Medications, and Allergies have been reviewed and updated if relevant.  Patient Active Problem List   Diagnosis Date Noted  . Carpal tunnel syndrome, left 10/10/2014  . Numbness and tingling in left hand 05/13/2014  . Left arm pain 05/13/2014  . Allergic rhinitis 11/24/2012  . Decreased sensation of foot   . Family history of multiple myeloma 11/23/2012    Past Medical History:  Diagnosis Date  . Allergic rhinitis due to pollen   . Arthritis   . Decreased sensation of foot    Outer 3 toes  . Family history of multiple myeloma 11/23/2012    Past Surgical History:  Procedure Laterality Date  . FRACTURE SURGERY Right   . HIP SURGERY Right   . KNEE ARTHROSCOPY Right   . ORIF ANKLE FRACTURE Right 1995-1997  . ORIF TIBIA FRACTURE Right [phone]    Social History   Social History  . Marital status: Single    Spouse name: N/A  . Number of children: N/A  . Years of education: N/A   Occupational History  . Not on file.   Social History Main Topics  . Smoking status: Never Smoker  . Smokeless tobacco:  Never Used  . Alcohol use No  . Drug use: No  . Sexual activity: Yes    Partners: Female   Other Topics Concern  . Not on file   Social History Narrative   Formerly [employer] x 9 years    Family History  Problem Relation Age of Onset  . Multiple myeloma Father 24    Spinal cancer? (presume MM)    Allergies  Allergen Reactions  . Aspirin Anaphylaxis    Medication list reviewed and updated in full in [facility].   GEN: No acute illnesses, no fevers, chills. GI: No n/v/d, eating normally Pulm: No SOB Interactive and getting along well at home.  Otherwise, ROS is as per the HPI.  Objective:   BP 108/80   Pulse (!) 57   Temp 97.6 F (36.4 C) (Oral)   Ht _0  (1.803 m)   Wt 245 lb 12 oz (111.5 kg)   BMI 34.28 kg/m   GEN: WDWN, NAD, Non-toxic, A & O x 3 HEENT: Atraumatic, Normocephalic. Neck supple. No masses, No LAD. Ears and Nose: No external deformity. CV: RRR, No M/G/R. No JVD. No thrill. No extra heart sounds. PULM: CTA B, no wheezes, crackles, rhonchi. No retractions. No resp. distress. No accessory muscle use. EXTR: No c/c/e NEURO Normal gait.  PSYCH: Normally interactive. Conversant. Not depressed or  anxious appearing.  Calm demeanor.    great toe toenail splitting.  Laboratory and Imaging Data:  Assessment and Plan:   Splitting of nail  Need for prophylactic vaccination with combined diphtheria-tetanus-pertussis (DTP) vaccine - Plan: Tdap vaccine greater than or equal to 7yo IM  I gave him a printout from the [employer] of dermatology regarding this condition.  Recommended using a nail file to get them down little bit smaller but also not where they will be more likely to become ingrown.  There is some slight sick thickening to the nail, if this persists or worsens, we could treat him with antifungal medication.  Follow-up: No Follow-up on file.  Meds ordered this encounter  Medications  . vitamin B-12 (CYANOCOBALAMIN) 1000 MCG  tablet    Sig: Take 1,000 mcg by mouth daily.   There are no discontinued medications. Orders Placed This Encounter  Procedures  . Tdap vaccine greater than or equal to 7yo IM    Signed,  Edwyn Inclan T. Daphnie Venturini, MD     Medication List       Accurate as of 05/02/16 11:59 PM. Always use your most recent med list.          multivitamin with minerals Tabs tablet Take 1 tablet by mouth daily.   vitamin B-12 1000 MCG tablet Commonly known as:  CYANOCOBALAMIN Take 1,000 mcg by mouth daily.

## 2016-05-30 ENCOUNTER — Encounter: Payer: Self-pay | Admitting: Family Medicine

## 2016-06-12 ENCOUNTER — Ambulatory Visit: Payer: 59 | Admitting: Family Medicine

## 2016-06-18 ENCOUNTER — Encounter: Payer: Self-pay | Admitting: Family Medicine

## 2016-06-18 ENCOUNTER — Ambulatory Visit (INDEPENDENT_AMBULATORY_CARE_PROVIDER_SITE_OTHER): Payer: 59 | Admitting: Family Medicine

## 2016-06-18 VITALS — BP 122/74 | HR 74 | Temp 97.9°F | Ht 71.75 in | Wt 235.8 lb

## 2016-06-18 DIAGNOSIS — R5383 Other fatigue: Secondary | ICD-10-CM | POA: Diagnosis not present

## 2016-06-18 DIAGNOSIS — Z807 Family history of other malignant neoplasms of lymphoid, hematopoietic and related tissues: Secondary | ICD-10-CM | POA: Diagnosis not present

## 2016-06-18 DIAGNOSIS — R634 Abnormal weight loss: Secondary | ICD-10-CM | POA: Diagnosis not present

## 2016-06-18 DIAGNOSIS — Z125 Encounter for screening for malignant neoplasm of prostate: Secondary | ICD-10-CM

## 2016-06-18 DIAGNOSIS — K921 Melena: Secondary | ICD-10-CM

## 2016-06-18 NOTE — Progress Notes (Signed)
Dr. Frederico Hamman T. Furqan Gosselin, MD, [facility] [place], [address] Phone: [phone] Fax: [phone]  06/18/2016  Patient: Derek Huffman, MRN: [id number], DOB: 1970/11/05, 45 y.o.  Primary Physician:  Owens Loffler, MD   Chief Complaint  Patient presents with  . Fatigue    3 weeks and getting   Subjective:   Derek Huffman is a 45 y.o. very pleasant male patient who presents with the following:  For the last 3 weeks, feeling really tired - but losing weight.   Wt Readings from Last 3 Encounters:  06/18/16 235 lb 12.8 oz (107 kg)  05/02/16 245 lb 12 oz (111.5 kg)  12/12/14 253 lb 8 oz (115 kg)    Very tired - eats just enough.  Sleeping 7-8 hours a night. Does not snore - no choking.   Separating from fiance.  Really fatigued, tired.  Has not been sick or URI, flu type symptoms.  Started - had a little bit of blood in the stool recently.   He has never been a smoker.  He is really worried about cancer with his weight loss.  Has had some intentional trying, but a lot recently, and generally does not feel well.    Past Medical History, Surgical History, Social History, Family History, Problem List, Medications, and Allergies have been reviewed and updated if relevant.  Patient Active Problem List   Diagnosis Date Noted  . Carpal tunnel syndrome, left 10/10/2014  . Numbness and tingling in left hand 05/13/2014  . Left arm pain 05/13/2014  . Allergic rhinitis 11/24/2012  . Decreased sensation of foot   . Family history of multiple myeloma 11/23/2012    Past Medical History:  Diagnosis Date  . Allergic rhinitis due to pollen   . Arthritis   . Decreased sensation of foot    Outer 3 toes  . Family history of multiple myeloma 11/23/2012    Past Surgical History:  Procedure Laterality Date  . FRACTURE SURGERY Right   . HIP SURGERY Right   . KNEE ARTHROSCOPY Right   . ORIF ANKLE FRACTURE Right  1995-1997  . ORIF TIBIA FRACTURE Right [phone]    Social History   Social History  . Marital status: Single    Spouse name: N/A  . Number of children: N/A  . Years of education: N/A   Occupational History  . Not on file.   Social History Main Topics  . Smoking status: Never Smoker  . Smokeless tobacco: Never Used  . Alcohol use No  . Drug use: No  . Sexual activity: Yes    Partners: Female   Other Topics Concern  . Not on file   Social History Narrative   Formerly [employer] x 9 years    Family History  Problem Relation Age of Onset  . Multiple myeloma Father 65    Spinal cancer? (presume MM)    Allergies  Allergen Reactions  . Aspirin Anaphylaxis    Medication list reviewed and updated in full in [place].   GEN: No acute illnesses, no fevers, chills. GI: No n/v/d, eating normally Pulm: No SOB Interactive and getting along well at home.  Otherwise, ROS is as per the HPI.  Objective:   BP 122/74   Pulse 74   Temp 97.9 F (36.6 C) (Oral)   Ht 5' 11.75" (1.822 m)   Wt 235 lb 12.8 oz (107 kg)   SpO2 92%  BMI 32.20 kg/m   GEN: WDWN, NAD, Non-toxic, A & O x 3 HEENT: Atraumatic, Normocephalic. Neck supple. No masses, No LAD. Ears and Nose: No external deformity. CV: RRR, No M/G/R. No JVD. No thrill. No extra heart sounds. PULM: CTA B, no wheezes, crackles, rhonchi. No retractions. No resp. distress. No accessory muscle use. EXTR: No c/c/e NEURO Normal gait.  PSYCH: fairly flat affect  Laboratory and Imaging Data:  Assessment and Plan:   Other fatigue - Plan: Hepatic function panel, T3, free, T4, free, TSH, Ferritin, HIV antibody, Vitamin Z61, Basic metabolic panel, CBC with Differential/Platelet, Protein electrophoresis, Kappa/lambda light chains, Multiple Myeloma Panel (SPEP&IFE w/QIG), CANCELED: Multiple Myeloma Panel (SPEP&IFE w/QIG), CANCELED: Kappa/lambda light chains, CANCELED: Protein electrophoresis, serum, CANCELED: CBC with  Differential/Platelet, CANCELED: Basic metabolic panel, CANCELED: Vitamin B12, CANCELED: HIV antibody, CANCELED: Ferritin, CANCELED: TSH, CANCELED: T3, free, CANCELED: T4, free, CANCELED: PSA, CANCELED: Hepatic function panel  Family history of multiple myeloma - Plan: Multiple Myeloma Panel (SPEP&IFE w/QIG), CANCELED: Multiple Myeloma Panel (SPEP&IFE w/QIG), CANCELED: Kappa/lambda light chains, CANCELED: Protein electrophoresis, serum  Abnormal weight loss - Plan: Hepatic function panel, T3, free, T4, free, TSH, Ferritin, HIV antibody, Vitamin W96, Basic metabolic panel, CBC with Differential/Platelet, Protein electrophoresis, Kappa/lambda light chains, Multiple Myeloma Panel (SPEP&IFE w/QIG), CANCELED: Multiple Myeloma Panel (SPEP&IFE w/QIG), CANCELED: Kappa/lambda light chains, CANCELED: Protein electrophoresis, serum, CANCELED: CBC with Differential/Platelet, CANCELED: Basic metabolic panel, CANCELED: Vitamin B12, CANCELED: HIV antibody, CANCELED: Ferritin, CANCELED: TSH, CANCELED: T3, free, CANCELED: T4, free, CANCELED: PSA, CANCELED: Hepatic function panel  Screening for malignant neoplasm of prostate - Plan: PSA, CANCELED: PSA  Blood in stool  20 pound weight loss, fatigue, feeling unwell - multiple possibilities. Check labs first.   Also with blood in stool.   Follow-up: No Follow-up on file.  There are no discontinued medications. Orders Placed This Encounter  Procedures  . Hepatic function panel  . PSA  . T3, free  . T4, free  . TSH  . Ferritin  . HIV antibody  . Vitamin B12  . Basic metabolic panel  . CBC with Differential/Platelet  . Protein electrophoresis  . Kappa/lambda light chains  . Multiple Myeloma Panel (SPEP&IFE w/QIG)    Signed,  Welda Azzarello T. Querida Beretta, MD   Allergies as of 06/18/2016      Reactions   Aspirin Anaphylaxis      Medication List       Accurate as of 06/18/16  1:31 PM. Always use your most recent med list.          multivitamin with  minerals Tabs tablet Take 1 tablet by mouth daily.   vitamin B-12 1000 MCG tablet Commonly known as:  CYANOCOBALAMIN Take 1,000 mcg by mouth daily.

## 2016-06-18 NOTE — Progress Notes (Signed)
Pre visit review using our clinic review tool, if applicable. No additional management support is needed unless otherwise documented below in the visit note. 

## 2016-06-21 LAB — CBC WITH DIFFERENTIAL/PLATELET
BASOS ABS: 0 10*3/uL (ref 0.0–0.2)
Basos: 0 %
EOS (ABSOLUTE): 0.1 10*3/uL (ref 0.0–0.4)
Eos: 3 %
Hematocrit: 45.3 % (ref 37.5–51.0)
Hemoglobin: 15 g/dL (ref 13.0–17.7)
IMMATURE GRANS (ABS): 0 10*3/uL (ref 0.0–0.1)
IMMATURE GRANULOCYTES: 0 %
LYMPHS: 27 %
Lymphocytes Absolute: 0.9 10*3/uL (ref 0.7–3.1)
MCH: 27.6 pg (ref 26.6–33.0)
MCHC: 33.1 g/dL (ref 31.5–35.7)
MCV: 83 fL (ref 79–97)
MONOS ABS: 0.3 10*3/uL (ref 0.1–0.9)
Monocytes: 10 %
NEUTROS PCT: 60 %
Neutrophils Absolute: 2.1 10*3/uL (ref 1.4–7.0)
PLATELETS: 266 10*3/uL (ref 150–379)
RBC: 5.44 x10E6/uL (ref 4.14–5.80)
RDW: 13.8 % (ref 12.3–15.4)
WBC: 3.5 10*3/uL (ref 3.4–10.8)

## 2016-06-21 LAB — KAPPA/LAMBDA LIGHT CHAINS
Ig Kappa Free Light Chain: 16.9 mg/L (ref 3.3–19.4)
Ig Lambda Free Light Chain: 11.6 mg/L (ref 5.7–26.3)
Kappa/Lambda FluidC Ratio: 1.46 (ref 0.26–1.65)

## 2016-06-21 LAB — BASIC METABOLIC PANEL
BUN/Creatinine Ratio: 6 — ABNORMAL LOW (ref 9–20)
BUN: 7 mg/dL (ref 6–24)
CALCIUM: 9.4 mg/dL (ref 8.7–10.2)
CHLORIDE: 103 mmol/L (ref 96–106)
CO2: 26 mmol/L (ref 18–29)
Creatinine, Ser: 1.22 mg/dL (ref 0.76–1.27)
GFR calc Af Amer: 83 mL/min/{1.73_m2} (ref >59)
GFR calc non Af Amer: 72 mL/min/{1.73_m2} (ref >59)
GLUCOSE: 104 mg/dL — AB (ref 65–99)
POTASSIUM: 4 mmol/L (ref 3.5–5.2)
SODIUM: 144 mmol/L (ref 134–144)

## 2016-06-21 LAB — HEPATIC FUNCTION PANEL
ALT: 25 IU/L (ref 0–44)
AST: 22 IU/L (ref 0–40)
Albumin: 4.4 g/dL (ref 3.5–5.5)
Alkaline Phosphatase: 118 IU/L — ABNORMAL HIGH (ref 39–117)
BILIRUBIN TOTAL: 1.2 mg/dL (ref 0.0–1.2)
BILIRUBIN, DIRECT: 0.25 mg/dL (ref 0.00–0.40)

## 2016-06-21 LAB — MULTIPLE MYELOMA PANEL, SERUM
ALBUMIN/GLOB SERPL: 1.3 (ref 0.7–1.7)
Albumin SerPl Elph-Mcnc: 3.8 g/dL (ref 2.9–4.4)
Alpha 1: 0.2 g/dL (ref 0.0–0.4)
Alpha2 Glob SerPl Elph-Mcnc: 0.6 g/dL (ref 0.4–1.0)
B-Globulin SerPl Elph-Mcnc: 1.1 g/dL (ref 0.7–1.3)
GAMMA GLOB SERPL ELPH-MCNC: 1.1 g/dL (ref 0.4–1.8)
Globulin, Total: 3.1 g/dL (ref 2.2–3.9)
IGA/IMMUNOGLOBULIN A, SERUM: 131 mg/dL (ref 90–386)
IGM (IMMUNOGLOBULIN M), SRM: 54 mg/dL (ref 20–172)
IgG (Immunoglobin G), Serum: 1142 mg/dL (ref 700–1600)
Total Protein: 6.9 g/dL (ref 6.0–8.5)

## 2016-06-21 LAB — T3, FREE: T3, Free: 3.3 pg/mL (ref 2.0–4.4)

## 2016-06-21 LAB — PROTEIN ELECTROPHORESIS

## 2016-06-21 LAB — T4, FREE: Free T4: 1.21 ng/dL (ref 0.82–1.77)

## 2016-06-21 LAB — HIV ANTIBODY (ROUTINE TESTING W REFLEX): HIV SCREEN 4TH GENERATION: NONREACTIVE

## 2016-06-21 LAB — PSA: Prostate Specific Ag, Serum: 1 ng/mL (ref 0.0–4.0)

## 2016-06-21 LAB — TSH: TSH: 0.442 u[IU]/mL — ABNORMAL LOW (ref 0.450–4.500)

## 2016-06-21 LAB — VITAMIN B12

## 2016-06-21 LAB — FERRITIN: Ferritin: 150 ng/mL (ref 30–400)

## 2016-12-29 IMAGING — CR DG CHEST 2V
1 series · 2 of 2 positions shown · non-contrast
Comparison: None

CLINICAL DATA: Shortness of breath for 1 day, feels like drowning
in fluid when lying down

EXAM:
CHEST  2 VIEW

[Series 1: dxr chest pa (or ap) and lateral · 0.14mm/px · 2 of 2 slices shown]
[im 1/2]
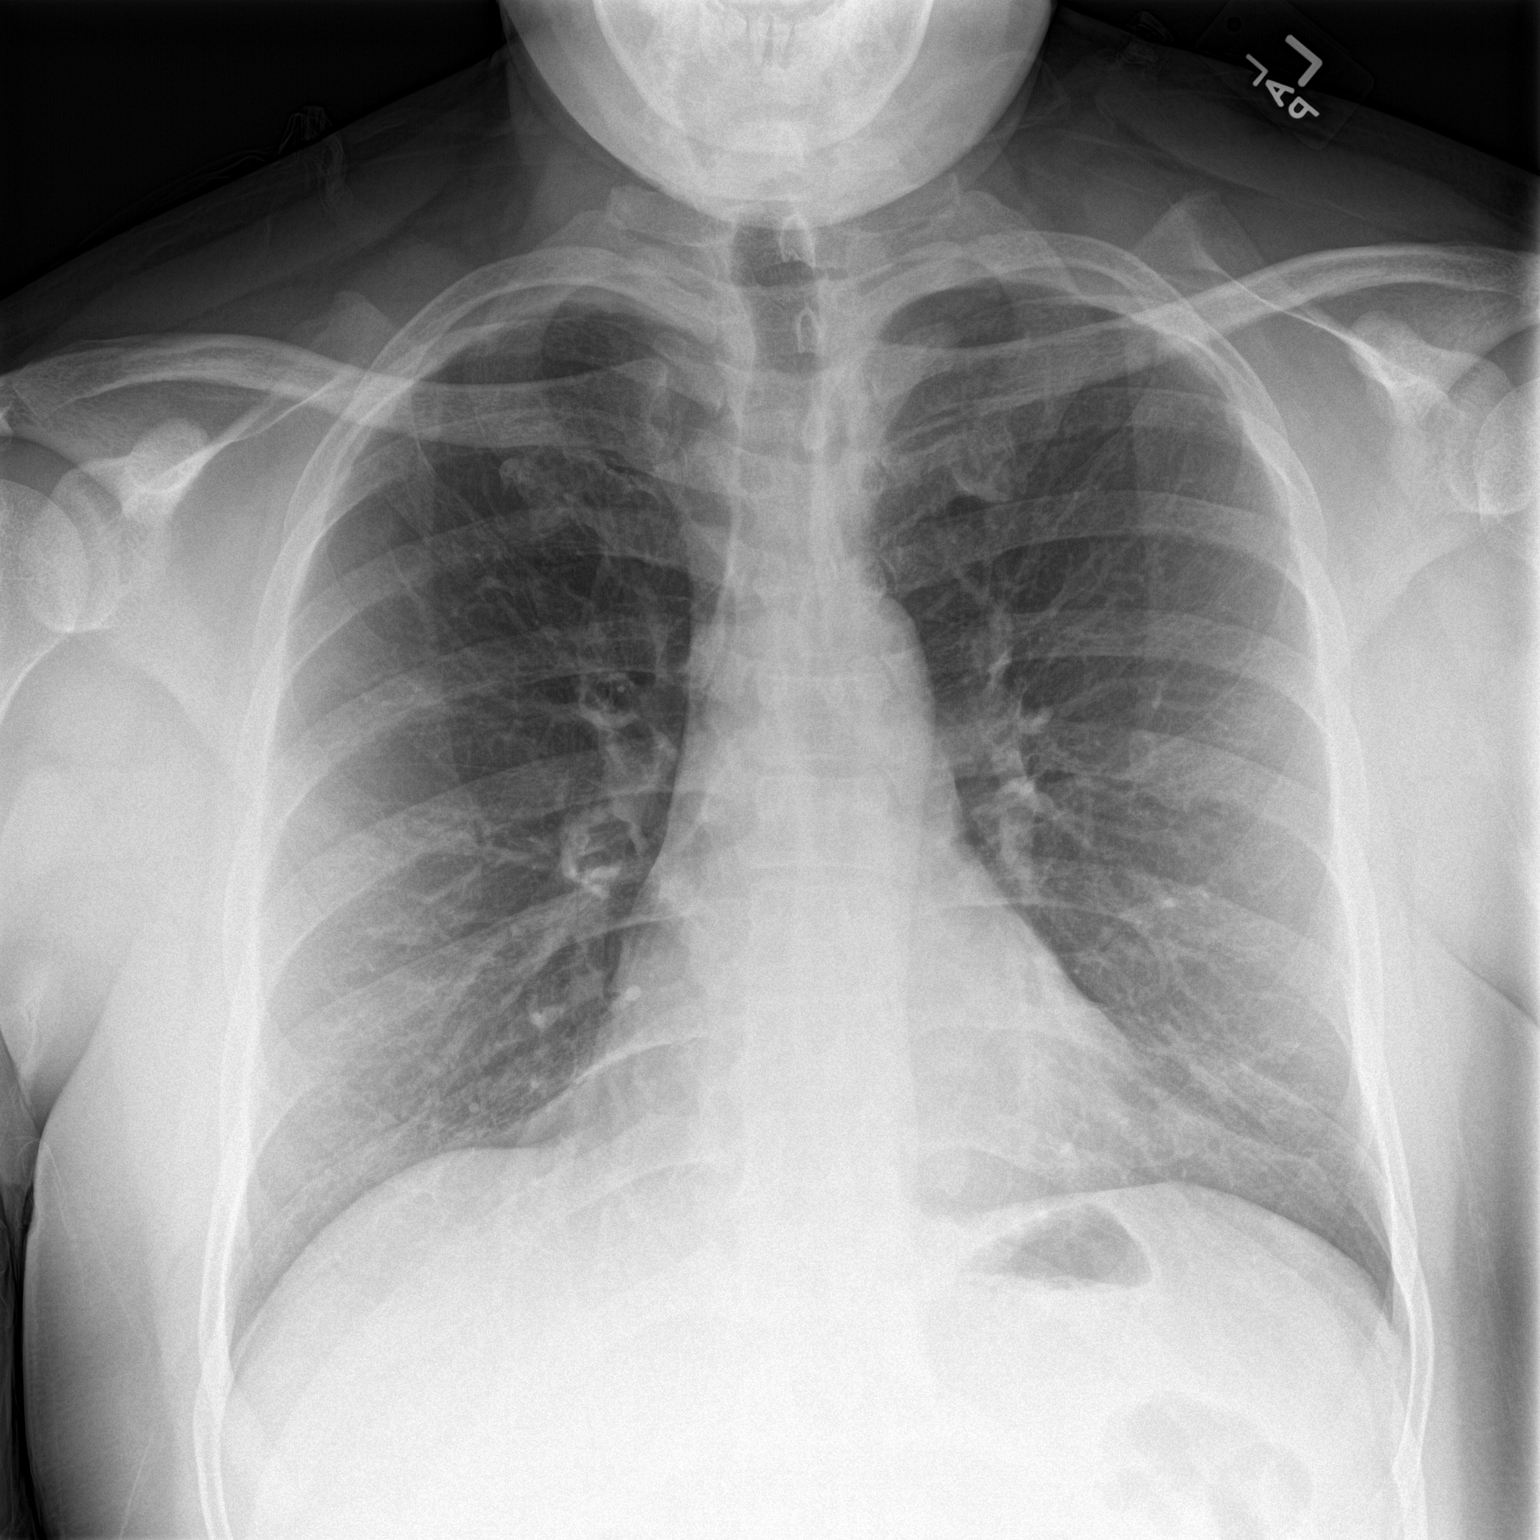
[im 2/2]
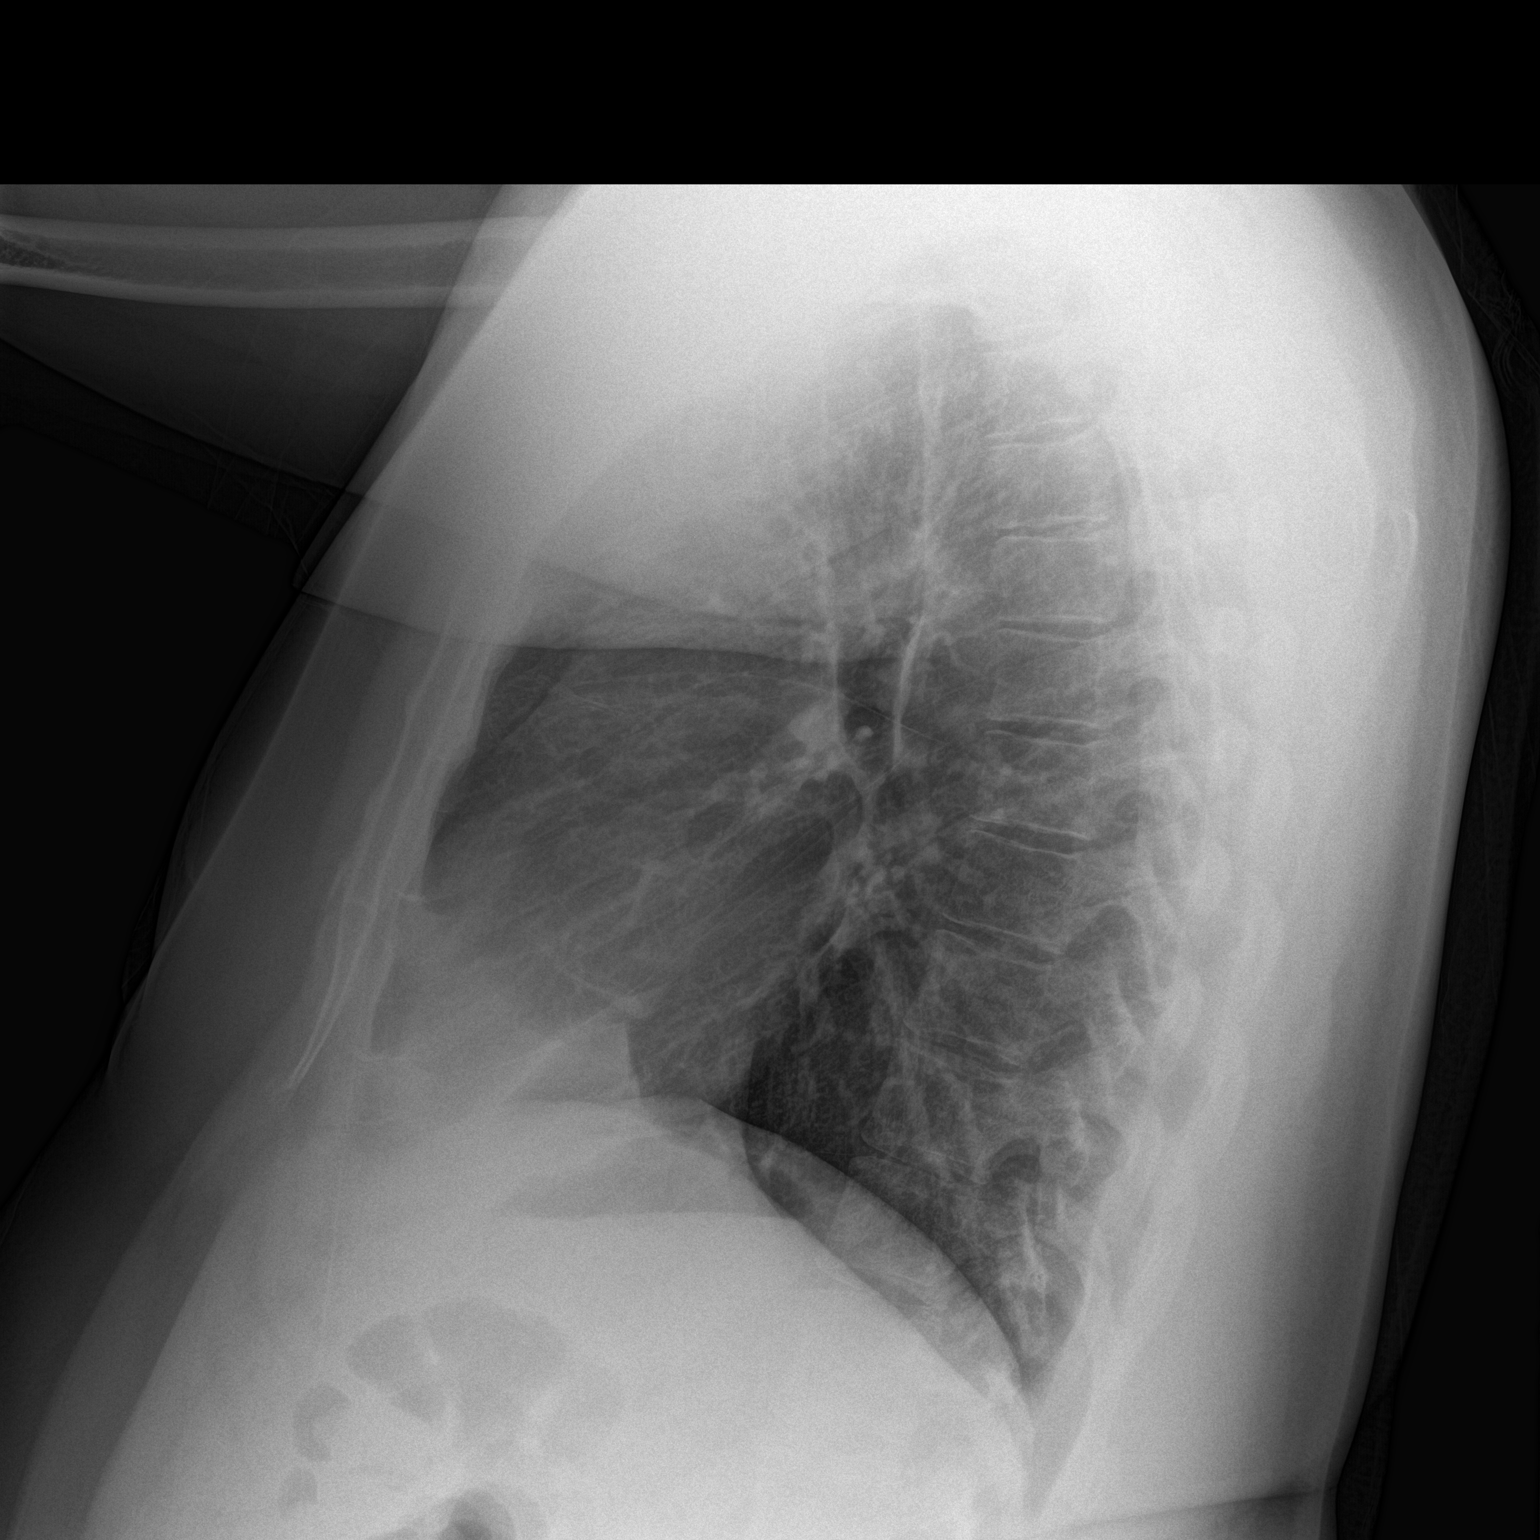

[2 of 2 positions shown; findings below may reference images not displayed]

FINDINGS: Normal heart size, mediastinal contours and pulmonary vascularity.

Lungs clear.

No pleural effusion or pneumothorax.

Bones unremarkable.
IMPRESSION: Normal exam.
